# Patient Record
Sex: Male | Born: 1950 | Race: White | Hispanic: No | Marital: Married | Smoking: Former smoker
Health system: Southern US, Community
[De-identification: ages and names within clinical notes are randomized; demographics above are authoritative.]

## PROBLEM LIST (undated history)

## (undated) DIAGNOSIS — C44321 Squamous cell carcinoma of skin of nose: Principal | ICD-10-CM

## (undated) DIAGNOSIS — H547 Unspecified visual loss: Secondary | ICD-10-CM

## (undated) DIAGNOSIS — G4733 Obstructive sleep apnea (adult) (pediatric): Secondary | ICD-10-CM

## (undated) DIAGNOSIS — I10 Essential (primary) hypertension: Secondary | ICD-10-CM

## (undated) DIAGNOSIS — J349 Unspecified disorder of nose and nasal sinuses: Secondary | ICD-10-CM

## (undated) DIAGNOSIS — C801 Malignant (primary) neoplasm, unspecified: Secondary | ICD-10-CM

## (undated) DIAGNOSIS — K219 Gastro-esophageal reflux disease without esophagitis: Secondary | ICD-10-CM

## (undated) DIAGNOSIS — D630 Anemia in neoplastic disease: Principal | ICD-10-CM

## (undated) HISTORY — PX: CATARACT EXTRACTION: SUR2

## (undated) HISTORY — PX: PARTIAL HIP ARTHROPLASTY: SHX733

## (undated) HISTORY — PX: ENDARTERECTOMY: SHX5162

## (undated) HISTORY — DX: Obstructive sleep apnea (adult) (pediatric): G47.33

## (undated) HISTORY — PX: ESOPHAGEAL DILATION: SHX303

## (undated) HISTORY — DX: Squamous cell carcinoma of skin of nose: C44.321

## (undated) HISTORY — DX: Anemia in neoplastic disease: D63.0

## (undated) HISTORY — PX: FACIAL RHYTIDECTOMY: SHX1573

---

## 2014-10-28 ENCOUNTER — Encounter (HOSPITAL_COMMUNITY): Payer: Self-pay | Admitting: Emergency Medicine

## 2014-10-28 DIAGNOSIS — R5383 Other fatigue: Secondary | ICD-10-CM | POA: Diagnosis not present

## 2014-10-28 DIAGNOSIS — Z91041 Radiographic dye allergy status: Secondary | ICD-10-CM | POA: Insufficient documentation

## 2014-10-28 DIAGNOSIS — C7989 Secondary malignant neoplasm of other specified sites: Secondary | ICD-10-CM | POA: Diagnosis not present

## 2014-10-28 DIAGNOSIS — C7839 Secondary malignant neoplasm of other respiratory organs: Secondary | ICD-10-CM | POA: Diagnosis not present

## 2014-10-28 DIAGNOSIS — I10 Essential (primary) hypertension: Secondary | ICD-10-CM | POA: Diagnosis not present

## 2014-10-28 DIAGNOSIS — K219 Gastro-esophageal reflux disease without esophagitis: Secondary | ICD-10-CM | POA: Diagnosis not present

## 2014-10-28 DIAGNOSIS — Z9689 Presence of other specified functional implants: Secondary | ICD-10-CM | POA: Insufficient documentation

## 2014-10-28 DIAGNOSIS — E878 Other disorders of electrolyte and fluid balance, not elsewhere classified: Secondary | ICD-10-CM | POA: Diagnosis not present

## 2014-10-28 DIAGNOSIS — E860 Dehydration: Secondary | ICD-10-CM | POA: Diagnosis present

## 2014-10-28 LAB — CBC WITH DIFFERENTIAL/PLATELET
BASOS ABS: 0 10*3/uL (ref 0.0–0.1)
BASOS PCT: 0 % (ref 0–1)
EOS ABS: 0.2 10*3/uL (ref 0.0–0.7)
EOS PCT: 1 % (ref 0–5)
HEMATOCRIT: 34.8 % — AB (ref 39.0–52.0)
Hemoglobin: 11.2 g/dL — ABNORMAL LOW (ref 13.0–17.0)
LYMPHS PCT: 10 % — AB (ref 12–46)
Lymphs Abs: 1.6 10*3/uL (ref 0.7–4.0)
MCH: 28.4 pg (ref 26.0–34.0)
MCHC: 32.2 g/dL (ref 30.0–36.0)
MCV: 88.3 fL (ref 78.0–100.0)
MONO ABS: 1.1 10*3/uL — AB (ref 0.1–1.0)
Monocytes Relative: 7 % (ref 3–12)
Neutro Abs: 12.5 10*3/uL — ABNORMAL HIGH (ref 1.7–7.7)
Neutrophils Relative %: 82 % — ABNORMAL HIGH (ref 43–77)
Platelets: 412 10*3/uL — ABNORMAL HIGH (ref 150–400)
RBC: 3.94 MIL/uL — ABNORMAL LOW (ref 4.22–5.81)
RDW: 15.2 % (ref 11.5–15.5)
WBC: 15.4 10*3/uL — ABNORMAL HIGH (ref 4.0–10.5)

## 2014-10-28 LAB — COMPREHENSIVE METABOLIC PANEL
ALT: 10 U/L (ref 0–53)
AST: 12 U/L (ref 0–37)
Albumin: 3.5 g/dL (ref 3.5–5.2)
Alkaline Phosphatase: 124 U/L — ABNORMAL HIGH (ref 39–117)
Anion gap: 19 — ABNORMAL HIGH (ref 5–15)
BUN: 13 mg/dL (ref 6–23)
CALCIUM: 9.8 mg/dL (ref 8.4–10.5)
CO2: 23 mEq/L (ref 19–32)
CREATININE: 0.98 mg/dL (ref 0.50–1.35)
Chloride: 88 mEq/L — ABNORMAL LOW (ref 96–112)
GFR calc Af Amer: >90 mL/min (ref >90)
GFR, EST NON AFRICAN AMERICAN: 85 mL/min — AB (ref >90)
Glucose, Bld: 98 mg/dL (ref 70–99)
Potassium: 5.5 mEq/L — ABNORMAL HIGH (ref 3.7–5.3)
Sodium: 130 mEq/L — ABNORMAL LOW (ref 137–147)
TOTAL PROTEIN: 8.2 g/dL (ref 6.0–8.3)
Total Bilirubin: 0.4 mg/dL (ref 0.3–1.2)

## 2014-10-28 NOTE — ED Notes (Signed)
Pt. requesting blood tests for electrolytes , pt. suspects dehydration due to poor appetite , low fluid intake , insomnia and headache.

## 2014-10-29 ENCOUNTER — Emergency Department (HOSPITAL_COMMUNITY)
Admission: EM | Admit: 2014-10-29 | Discharge: 2014-10-29 | Disposition: A | Discharge status: Home / Self Care | Payer: BC Managed Care – PPO | Attending: Emergency Medicine | Admitting: Emergency Medicine

## 2014-10-29 ENCOUNTER — Emergency Department (HOSPITAL_COMMUNITY): Payer: BC Managed Care – PPO

## 2014-10-29 DIAGNOSIS — E860 Dehydration: Secondary | ICD-10-CM

## 2014-10-29 DIAGNOSIS — R5383 Other fatigue: Secondary | ICD-10-CM

## 2014-10-29 HISTORY — DX: Gastro-esophageal reflux disease without esophagitis: K21.9

## 2014-10-29 HISTORY — DX: Malignant (primary) neoplasm, unspecified: C80.1

## 2014-10-29 HISTORY — DX: Essential (primary) hypertension: I10

## 2014-10-29 LAB — I-STAT CHEM 8, ED
BUN: 9 mg/dL (ref 6–23)
Calcium, Ion: 1.14 mmol/L (ref 1.13–1.30)
Chloride: 95 mEq/L — ABNORMAL LOW (ref 96–112)
Creatinine, Ser: 0.9 mg/dL (ref 0.50–1.35)
Glucose, Bld: 127 mg/dL — ABNORMAL HIGH (ref 70–99)
HCT: 34 % — ABNORMAL LOW (ref 39.0–52.0)
Hemoglobin: 11.6 g/dL — ABNORMAL LOW (ref 13.0–17.0)
Potassium: 4 mEq/L (ref 3.7–5.3)
Sodium: 132 mEq/L — ABNORMAL LOW (ref 137–147)
TCO2: 25 mmol/L (ref 0–100)

## 2014-10-29 LAB — URINALYSIS, ROUTINE W REFLEX MICROSCOPIC
Bilirubin Urine: NEGATIVE
Glucose, UA: NEGATIVE mg/dL
Ketones, ur: 15 mg/dL — AB
LEUKOCYTES UA: NEGATIVE
Nitrite: NEGATIVE
PROTEIN: NEGATIVE mg/dL
Specific Gravity, Urine: 1.005 (ref 1.005–1.030)
Urobilinogen, UA: 0.2 mg/dL (ref 0.0–1.0)
pH: 7.5 (ref 5.0–8.0)

## 2014-10-29 LAB — MAGNESIUM: Magnesium: 2.5 mg/dL (ref 1.5–2.5)

## 2014-10-29 LAB — URINE MICROSCOPIC-ADD ON

## 2014-10-29 MED ORDER — ONDANSETRON HCL 4 MG/2ML IJ SOLN
4.0000 mg | Freq: Once | INTRAMUSCULAR | Status: AC
Start: 2014-10-29 — End: 2014-10-29
  Administered 2014-10-29: 4 mg via INTRAVENOUS

## 2014-10-29 MED ORDER — MORPHINE SULFATE 4 MG/ML IJ SOLN
6.0000 mg | Freq: Once | INTRAMUSCULAR | Status: AC
  Administered 2014-10-29: 6 mg via INTRAVENOUS
  Filled 2014-10-29: qty 2

## 2014-10-29 MED ORDER — DEXTROSE 5 % IV BOLUS
1000.0000 mL | Freq: Once | INTRAVENOUS | Status: AC
  Administered 2014-10-29: 1000 mL via INTRAVENOUS

## 2014-10-29 MED ORDER — SODIUM CHLORIDE 0.9 % IV BOLUS (SEPSIS)
1000.0000 mL | Freq: Once | INTRAVENOUS | Status: AC
  Administered 2014-10-29: 1000 mL via INTRAVENOUS

## 2014-10-29 MED ORDER — SODIUM CHLORIDE 0.9 % IV BOLUS (SEPSIS): 2000.0000 mL | Freq: Once | INTRAVENOUS | Status: DC

## 2014-10-29 NOTE — ED Notes (Signed)
Pt is aware that urine is needed for testing, urinal at bedside. 

## 2014-10-29 NOTE — ED Notes (Signed)
Patient to xray.

## 2014-10-29 NOTE — ED Notes (Signed)
Patient returned from xray.

## 2014-10-29 NOTE — ED Provider Notes (Signed)
CSN: [id number]     Arrival date & time 10/28/14  2243 History   First MD Initiated Contact with Patient 10/29/14 0111     Chief Complaint  Patient presents with  . Dehydration     (Consider location/radiation/quality/duration/timing/severity/associated sxs/prior Treatment) HPI  Timothy Stevenson is a 63 y.o. 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 Patient states his cancer is terminal and he has been going through experimental treatments. He has an appointment on November 16 in [place] for more trials. He has not been taking any chemotherapy or radiation for the past month. Over the last 3 days he's noted decreased appetite and weakness. He has had this happen before he requires IV fluids. This occurred 4 months ago at [facility] where he was admitted for the same. He states he just has no appetite, he denies any abdominal pain nausea or vomiting. He subsequently has severe pain in his face as well. He takes oxycodone at home which is not treating it effectively. Because of all this patient had trouble sleeping as well. He's had no fevers or recent infections. He denies any changes in his urine. He has a history of constipation and is taking Maalox, his last bowel movement was today. Patient has no further complaints.  10 Systems reviewed and are negative for acute change except as noted in the HPI.     Past Medical History  Diagnosis Date  . Hypertension   . GERD (gastroesophageal reflux disease)   . Cancer     STAGE 4 SQUAMOUS CELL CARCINOMA " TERMINAL " METASTASIS TO SINUS./ FACE    Past Surgical History  Procedure Laterality Date  . Partial hip arthroplasty     No family history on file. History  Substance Use Topics  . Smoking status: Former Research scientist (life sciences)  . Smokeless tobacco: Not on file  . Alcohol Use: No    Review of Systems    Allergies  Ivp dye  Home Medications   Prior  to Admission medications   Medication Sig Start Date End Date Taking? Authorizing Provider  acetaminophen (TYLENOL) 500 MG tablet Take 1,000 mg by mouth every 6 (six) hours as needed for mild pain.   Yes Historical Provider, MD  amLODipine-valsartan (EXFORGE) 5-160 MG per tablet Take 1 tablet by mouth daily.   Yes Historical Provider, MD  ciprofloxacin (CILOXAN) 0.3 % ophthalmic solution Place 1 drop into the right eye 3 (three) times daily. Administer 1 drop, every 2 hours, while awake, for 2 days. Then 1 drop, every 4 hours, while awake, for the next 5 days.   Yes Historical Provider, MD  dexamethasone (DECADRON) 0.1 % ophthalmic suspension Place 1 drop into the right eye 3 (three) times daily.   Yes Historical Provider, MD  dexamethasone (DECADRON) 4 MG tablet Take 4 mg by mouth 2 (two) times daily with a meal.   Yes Historical Provider, MD  diphenhydramine-acetaminophen (TYLENOL PM) 25-500 MG TABS Take 2 tablets by mouth at bedtime as needed (pain/sleep).   Yes Historical Provider, MD  Magnesium Hydroxide (MILK OF MAGNESIA PO) Take 1 Applicatorful by mouth daily as needed.   Yes Historical Provider, MD  omeprazole (PRILOSEC) 20 MG capsule Take 20 mg by mouth daily.   Yes Historical Provider, MD  oxyCODONE (ROXICODONE) 15 MG immediate release tablet Take 15 mg by mouth every morning.   Yes Historical Provider, MD  Oxycodone HCl 10 MG TABS Take 10 mg  by mouth every 4 (four) hours as needed (pain).   Yes Historical Provider, MD   BP 165/74 mmHg  Pulse 93  Temp(Src) 98.9 F (37.2 C) (Oral)  Resp 18  Ht 5\' 9"  (1.753 m)  Wt 139 lb (63.05 kg)  BMI 20.52 kg/m2  SpO2 99% Physical Exam  Constitutional: He is oriented to person, place, and time. Vital signs are normal. He appears well-developed and well-nourished.  Non-toxic appearance. He does not appear ill. No distress.  HENT:  Nose: Nose normal.  Mouth/Throat: Oropharynx is clear and moist. No oropharyngeal exudate.  Entire left side of face  is status post removal with synthetic material replacing it.  Eyes: Conjunctivae and EOM are normal. Pupils are equal, round, and reactive to light. No scleral icterus.  Neck: Normal range of motion. Neck supple. No tracheal deviation, no edema, no erythema and normal range of motion present. No thyroid mass and no thyromegaly present.  Cardiovascular: Normal rate, regular rhythm, S1 normal, S2 normal, normal heart sounds, intact distal pulses and normal pulses.  Exam reveals no gallop and no friction rub.   No murmur heard. Pulses:      Radial pulses are 2+ on the right side, and 2+ on the left side.       Dorsalis pedis pulses are 2+ on the right side, and 2+ on the left side.  Pulmonary/Chest: Effort normal and breath sounds normal. No respiratory distress. He has no wheezes. He has no rhonchi. He has no rales.  Abdominal: Soft. Normal appearance and bowel sounds are normal. He exhibits no distension, no ascites and no mass. There is no hepatosplenomegaly. There is no tenderness. There is no rebound, no guarding and no CVA tenderness.  Musculoskeletal: Normal range of motion. He exhibits no edema or tenderness.  Lymphadenopathy:    He has no cervical adenopathy.  Neurological: He is alert and oriented to person, place, and time. He has normal strength. No sensory deficit. He exhibits normal muscle tone. GCS eye subscore is 4. GCS verbal subscore is 5. GCS motor subscore is 6.  Skin: Skin is warm, dry and intact. No petechiae and no rash noted. He is not diaphoretic. No erythema. No pallor.  Psychiatric: He has a normal mood and affect. His behavior is normal. Judgment normal.  Nursing note and vitals reviewed.   ED Course  Procedures (including critical care time) Labs Review Labs Reviewed  CBC WITH DIFFERENTIAL - Abnormal; Notable for the following:    WBC 15.4 (*)    RBC 3.94 (*)    Hemoglobin 11.2 (*)    HCT 34.8 (*)    Platelets 412 (*)    Neutrophils Relative % 82 (*)    Neutro  Abs 12.5 (*)    Lymphocytes Relative 10 (*)    Monocytes Absolute 1.1 (*)    All other components within normal limits  COMPREHENSIVE METABOLIC PANEL - Abnormal; Notable for the following:    Sodium 130 (*)    Potassium 5.5 (*)    Chloride 88 (*)    Alkaline Phosphatase 124 (*)    GFR calc non Af Amer 85 (*)    Anion gap 19 (*)    All other components within normal limits  URINE CULTURE  URINALYSIS, ROUTINE W REFLEX MICROSCOPIC  MAGNESIUM    Imaging Review No results found.   EKG Interpretation None      MDM   Final diagnoses:  Fatigue    Patient presents emergency department for dehydration and electrolyte  abnormalities. He was given a liter normal saline and D5 normal saline for treatment. Patient has a small anion gap. Also given morphine for pain. He was also given food to eat. He was able to tolerate this and states he feels better. Patient has pain medication at home and also follow-up in [facility] Monday. Repeat labs revealed anion gap is closed at 12. His vital signs remain within his normal limits and is safe for discharge.  Everlene Balls, MD 10/29/14 0630

## 2014-10-29 NOTE — Discharge Instructions (Signed)
Dehydration, Adult Timothy Stevenson, you were seen today for dehydration. You were given fluids and food. Follow-up with your medical appointment in [place] this coming Monday. If any symptoms worsen come back to emergency department a medially for repeat evaluation. Thank you. Dehydration means your body does not have as much fluid as it needs. Your kidneys, brain, and heart will not work properly without the right amount of fluids and salt.  HOME CARE  Ask your doctor how to replace body fluid losses (rehydrate).  Drink enough fluids to keep your pee (urine) clear or pale yellow.  Drink small amounts of fluids often if you feel sick to your stomach (nauseous) or throw up (vomit).  Eat like you normally do.  Avoid:  Foods or drinks high in sugar.  Bubbly (carbonated) drinks.  Juice.  Very hot or cold fluids.  Drinks with caffeine.  Fatty, greasy foods.  Alcohol.  Tobacco.  Eating too much.  Gelatin desserts.  Wash your hands to avoid spreading germs (bacteria, viruses).  Only take medicine as told by your doctor.  Keep all doctor visits as told. GET HELP RIGHT AWAY IF:   You cannot drink something without throwing up.  You get worse even with treatment.  Your vomit has blood in it or looks greenish.  Your poop (stool) has blood in it or looks black and tarry.  You have not peed in 6 to 8 hours.  You pee a small amount of very dark pee.  You have a fever.  You pass out (faint).  You have belly (abdominal) pain that gets worse or stays in one spot (localizes).  You have a rash, stiff neck, or bad headache.  You get easily annoyed, sleepy, or are hard to wake up.  You feel weak, dizzy, or very thirsty. MAKE SURE YOU:   Understand these instructions.  Will watch your condition.  Will get help right away if you are not doing well or get worse. Document Released: 09/28/2009 Document Revised: 02/24/2012 Document Reviewed: 07/22/2011 [facility]  Patient Information 2015 [place], [place]. This information is not intended to replace advice given to you by your health care provider. Make sure you discuss any questions you have with your health care provider.

## 2014-10-30 LAB — URINE CULTURE
Colony Count: NO GROWTH
Culture: NO GROWTH

## 2015-01-16 ENCOUNTER — Telehealth: Payer: Self-pay | Admitting: Hematology and Oncology

## 2015-01-16 NOTE — Telephone Encounter (Signed)
Per staff message from NG schedule pt for new pt appt 2/3 @ 2pm - 1:30pm for [facility]. left message for pt. will call again tomorrow. Also alerted Blima Singer in HIM.

## 2015-01-17 ENCOUNTER — Telehealth: Payer: Self-pay | Admitting: *Deleted

## 2015-01-17 NOTE — Telephone Encounter (Signed)
Placed introductory call to patient, briefly explained my role as the Head & Neck Oncology Navigator and that I would be joining him during his appt with Dr. Alvy Bimler.  I confirmed his understanding of his 1:30 FC appt and 2:00 appt with Dr. Alvy Bimler.  I encouraged him to bring documentation of health history including information re: medication he is considering for his cancer tmt, list of medications.  I confirmed his understanding of [place] location, explained availability of valet service, explained check-in procedure.    Gayleen Orem, RN, BSN, [place] at [place] [phone]

## 2015-01-18 ENCOUNTER — Ambulatory Visit (HOSPITAL_BASED_OUTPATIENT_CLINIC_OR_DEPARTMENT_OTHER): Payer: Medicare Other | Admitting: Hematology and Oncology

## 2015-01-18 ENCOUNTER — Encounter: Payer: Self-pay | Admitting: Hematology and Oncology

## 2015-01-18 ENCOUNTER — Encounter (INDEPENDENT_AMBULATORY_CARE_PROVIDER_SITE_OTHER): Payer: Self-pay

## 2015-01-18 ENCOUNTER — Ambulatory Visit (HOSPITAL_BASED_OUTPATIENT_CLINIC_OR_DEPARTMENT_OTHER): Payer: Medicare Other

## 2015-01-18 ENCOUNTER — Encounter: Payer: Self-pay | Admitting: *Deleted

## 2015-01-18 ENCOUNTER — Ambulatory Visit: Payer: Medicare Other

## 2015-01-18 ENCOUNTER — Telehealth: Payer: Self-pay | Admitting: Hematology and Oncology

## 2015-01-18 VITALS — BP 156/78 | HR 83 | Temp 97.8°F | Resp 18 | Ht 69.0 in | Wt 118.2 lb

## 2015-01-18 DIAGNOSIS — H5440 Blindness, one eye, unspecified eye: Secondary | ICD-10-CM

## 2015-01-18 DIAGNOSIS — D630 Anemia in neoplastic disease: Secondary | ICD-10-CM

## 2015-01-18 DIAGNOSIS — C44321 Squamous cell carcinoma of skin of nose: Secondary | ICD-10-CM

## 2015-01-18 DIAGNOSIS — H9193 Unspecified hearing loss, bilateral: Secondary | ICD-10-CM

## 2015-01-18 DIAGNOSIS — H5441 Blindness, right eye, normal vision left eye: Secondary | ICD-10-CM

## 2015-01-18 DIAGNOSIS — G893 Neoplasm related pain (acute) (chronic): Secondary | ICD-10-CM

## 2015-01-18 HISTORY — DX: Squamous cell carcinoma of skin of nose: C44.321

## 2015-01-18 LAB — COMPREHENSIVE METABOLIC PANEL (CC13)
ALT: 8 U/L (ref 0–55)
ANION GAP: 14 meq/L — AB (ref 3–11)
AST: 11 U/L (ref 5–34)
Albumin: 3 g/dL — ABNORMAL LOW (ref 3.5–5.0)
Alkaline Phosphatase: 111 U/L (ref 40–150)
BILIRUBIN TOTAL: 0.29 mg/dL (ref 0.20–1.20)
BUN: 19.7 mg/dL (ref 7.0–26.0)
CALCIUM: 9.1 mg/dL (ref 8.4–10.4)
CHLORIDE: 98 meq/L (ref 98–109)
CO2: 24 meq/L (ref 22–29)
Creatinine: 1.2 mg/dL (ref 0.7–1.3)
EGFR: 66 mL/min/{1.73_m2} — ABNORMAL LOW (ref >90)
GLUCOSE: 112 mg/dL (ref 70–140)
Potassium: 4 mEq/L (ref 3.5–5.1)
Sodium: 136 mEq/L (ref 136–145)
Total Protein: 7.7 g/dL (ref 6.4–8.3)

## 2015-01-18 LAB — CBC WITH DIFFERENTIAL/PLATELET
BASO%: 1.1 % (ref 0.0–2.0)
Basophils Absolute: 0.2 10*3/uL — ABNORMAL HIGH (ref 0.0–0.1)
EOS%: 1.6 % (ref 0.0–7.0)
Eosinophils Absolute: 0.3 10*3/uL (ref 0.0–0.5)
HEMATOCRIT: 27.8 % — AB (ref 38.4–49.9)
HEMOGLOBIN: 8.5 g/dL — AB (ref 13.0–17.1)
LYMPH#: 1.7 10*3/uL (ref 0.9–3.3)
LYMPH%: 8.4 % — AB (ref 14.0–49.0)
MCH: 25.8 pg — ABNORMAL LOW (ref 27.2–33.4)
MCHC: 30.4 g/dL — AB (ref 32.0–36.0)
MCV: 84.9 fL (ref 79.3–98.0)
MONO#: 0.8 10*3/uL (ref 0.1–0.9)
MONO%: 3.9 % (ref 0.0–14.0)
NEUT%: 85 % — ABNORMAL HIGH (ref 39.0–75.0)
NEUTROS ABS: 17.2 10*3/uL — AB (ref 1.5–6.5)
PLATELETS: 547 10*3/uL — AB (ref 140–400)
RBC: 3.27 10*6/uL — AB (ref 4.20–5.82)
RDW: 16.7 % — ABNORMAL HIGH (ref 11.0–14.6)
WBC: 20.3 10*3/uL — ABNORMAL HIGH (ref 4.0–10.3)

## 2015-01-18 LAB — LACTATE DEHYDROGENASE (CC13): LDH: 117 U/L — ABNORMAL LOW (ref 125–245)

## 2015-01-18 MED ORDER — OXYCONTIN 10 MG PO T12A: 10.0000 mg | EXTENDED_RELEASE_TABLET | Freq: Two times a day (BID) | ORAL | Status: DC

## 2015-01-18 MED ORDER — OXYCODONE HCL 15 MG PO TABS
15.0000 mg | ORAL_TABLET | ORAL | Status: DC
Start: 2015-01-18 — End: 2015-01-18

## 2015-01-18 MED ORDER — OXYCODONE HCL 15 MG PO TABS: 15.0000 mg | ORAL_TABLET | Freq: Four times a day (QID) | ORAL | Status: DC | PRN

## 2015-01-18 NOTE — Progress Notes (Signed)
Checked in new pt with no financial concerns prior to seeing the dr.  Pt has 2 insurances so financial assistance may not be needed but he has Raquel's card for any billing questions or concerns. °

## 2015-01-18 NOTE — Telephone Encounter (Signed)
gv and printed appt sched and avs for pt for Feb...sed added tx...sent to lab

## 2015-01-19 ENCOUNTER — Other Ambulatory Visit: Payer: Self-pay | Admitting: Hematology and Oncology

## 2015-01-19 ENCOUNTER — Encounter: Payer: Self-pay | Admitting: Hematology and Oncology

## 2015-01-19 ENCOUNTER — Telehealth: Payer: Self-pay | Admitting: *Deleted

## 2015-01-19 ENCOUNTER — Encounter: Payer: Self-pay | Admitting: *Deleted

## 2015-01-19 DIAGNOSIS — H543 Unqualified visual loss, both eyes: Secondary | ICD-10-CM | POA: Insufficient documentation

## 2015-01-19 DIAGNOSIS — D630 Anemia in neoplastic disease: Secondary | ICD-10-CM | POA: Insufficient documentation

## 2015-01-19 DIAGNOSIS — C44321 Squamous cell carcinoma of skin of nose: Secondary | ICD-10-CM

## 2015-01-19 DIAGNOSIS — H9193 Unspecified hearing loss, bilateral: Secondary | ICD-10-CM | POA: Insufficient documentation

## 2015-01-19 DIAGNOSIS — G893 Neoplasm related pain (acute) (chronic): Secondary | ICD-10-CM | POA: Insufficient documentation

## 2015-01-19 HISTORY — DX: Anemia in neoplastic disease: D63.0

## 2015-01-19 NOTE — Assessment & Plan Note (Signed)
This is an unfortunate patient with significant reconstruction surgery, chemotherapy and radiation treatment with recurrence of disease. He had obtained multiple second opinions. The patient is aware that any form of treatment would be strictly palliative. He had obtained compassionate care use of Pembrolizumab from the drug company and is interested to try this. I will proceed to order baseline PET CT scan and MRI before we proceed with treatment. The patient will need chemotherapy education class and port placement along with blood work. I plan to see him back in 2 weeks to review all test results.

## 2015-01-19 NOTE — Progress Notes (Unsigned)
Delivered copies of patient's DNR, Living Will, and HCPOA to [facility] for scanning.  Gayleen Orem, RN, BSN, [place] at [place] [phone]

## 2015-01-19 NOTE — Telephone Encounter (Signed)
S/w pt's wife and informed her of Dr. Calton Dach message below.  She verbalized understanding.

## 2015-01-19 NOTE — Assessment & Plan Note (Signed)
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 I recommend he wear an eye patch at nighttime to protect his eye from further damage from dry eyes.

## 2015-01-19 NOTE — Assessment & Plan Note (Signed)
I refill his pressure and pain medicine. We discussed about narcotic refill policy.

## 2015-01-19 NOTE — Telephone Encounter (Signed)
-----   Message from Heath Lark, MD sent at 01/19/2015  2:14 PM EST ----- Regarding: lab result Pls let the patient/care giver know that he is profoundly anemic yesterday presumably from bleeding. I recommend he takes an iron supplement OTC (cheaper the better) at night time before going to bed. I plan to recheck it again when he returns in 2 weeks.

## 2015-01-19 NOTE — Assessment & Plan Note (Signed)
He has significant bilateral hearing loss which I think could be disease-related and related to prior treatment with cisplatin. For this reason, I would not give him any more chemotherapy with cisplatin.

## 2015-01-19 NOTE — Progress Notes (Signed)
Met with patient during initial consult with Dr. Alvy Bimler.  His wife and step-dtr accompanied him.   1. Further explained my role as his navigator and as a member of the Care Team, provided contact information, encouraged them to contact me with questions/concerns as treatments/procedures begin. 2. Provided New Patient Information packet:  Contact information for physician and navigator  Fall Prevention Patient Safety Plan  Appointment Guideline  [facility] campus map with highlight of [place] 3. Wife brought DNR, Living Will, HCPOA documentation.  I made copies for delivery to [facility] for scanning. 4. Assisted with post-consult appt scheduling.  They understand they will get additional phone calls for scheduling of PET, MRI, PAC. 5. They understand they can contact me with questions/concerns.  Gayleen Orem, RN, BSN, [place] at [place] [phone]

## 2015-01-19 NOTE — Assessment & Plan Note (Signed)
This is likely anemia of chronic disease along with intermittent bleeding from his cancer. The patient denies recent history of bleeding such as epistaxis, hematuria or hematochezia. He is asymptomatic from the anemia. We will observe for now.  He does not require transfusion now.

## 2015-01-19 NOTE — Progress Notes (Signed)
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 male is here because of recurrent invasive squamous cell carcinoma. He had significant history treated elsewhere. Summary of oncologic history is as follows:   Squamous cell cancer of skin of ala nasi   12/20/2007 Surgery He had Mohs surgery to his face. Pathology showed squamous cell carcinoma   01/19/2009 Surgery He had repeat Mohs surgery to the face   05/19/2012 Surgery Left Caldwell-luc approach left infraorbital nerve with selective left neck LN dissection   05/06/2013 Surgery he has repeat nasal endoscopy with extensive sinus surgery, nose removal and reconstruction surgery   05/14/2013 Surgery he had exenteration of left orbital content, parotid dissection, facial nerve removal, radical neck dissection and free flap reconstruction   06/01/2013 Surgery he had reconstruction surgery and repair of CSF leak   07/06/2013 Surgery He had cranial procedure to repeat CSF leak   07/19/2013 - 08/30/2013 Radiation Therapy he had concurrent chemo/RT   07/19/2013 - 08/30/2013 Chemotherapy he had concurrent chemo/RT with cisplatin   09/29/2013 Imaging PET/CT scan showed residual disease in sunus bone and hypermetabolic LN in the right neck   12/08/2013 Surgery He had repeat nasal endoscopy and debridement   12/14/2013 Pathology Results Repeat sinus biopsy at [facility] confirmed recurrence of squamous cell carcinoma   03/21/2014 - 09/19/2014 Chemotherapy He received erlotinib at 75 mg/m2   05/03/2014 Imaging Ct sinus showed progression of disease   09/20/2014 Imaging CT sinus & MRI brain showed soft tissue thickening in sinuses worrisome of  disease recurrence   11/14/2014 - 11/14/2014 Chemotherapy He received only 1 dose of docetaxel    According to the patient, the first initial presentation was due to presence of a small pinpoint skin lesion in 2009. He had multiple surgery, chemotherapy and radiation therapy. He has exhausted almost all treatment options and recently has received insurance approval for compassionate care at use of Pembrolizumab. However, he was not able to find a cancer Center who is willing to give the treatment. He has family members here in [place] and is interested to transfer his care here. he has significant bilateral hearing deficit, worse in the left compared to the right. He denies difficulties with chewing food, swallowing difficulties, painful swallowing, changes in the quality of his voice or abnormal weight loss. He has intermittent bleeding from the right for head and have significant blurriness of vision from the right eye. He has altered taste sensation due to loss of smell perception. MEDICAL HISTORY:  Past Medical History  Diagnosis Date  . Hypertension   . GERD (gastroesophageal reflux disease)   . 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 Squamous cell cancer of skin of ala nasi 01/18/2015  . 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 OSA (obstructive sleep apnea)     SURGICAL HISTORY: Past Surgical History  Procedure Laterality Date  . Partial hip arthroplasty    . Endarterectomy    . Esophageal dilation    . Cataract extraction      SOCIAL HISTORY: History   Social History  . Marital Status: Married    Spouse Name: N/A    Number  of Children: N/A  . Years of Education: N/A   Occupational History  . Not on file.   Social History Main Topics  . Smoking status: Former Smoker -- 1.50 packs/day for 20 years    Quit date: 12/16/2004  . Smokeless tobacco: Never Used  . Alcohol Use: No  . Drug Use: No  . Sexual Activity: Not on  file   Other Topics Concern  . Not on file   Social History Narrative    FAMILY HISTORY: Family History  Problem Relation Age of Onset  . Cancer Mother     lymphoma  . Cancer Father     stomach ca  . Cancer Maternal Aunt     ca  . Cancer Maternal Uncle     ca  . Cancer Paternal Aunt     ca  . Cancer Paternal Uncle     ca    ALLERGIES:  is allergic to ivp dye.  MEDICATIONS:  Current Outpatient Prescriptions  Medication Sig Dispense Refill  . acetaminophen (TYLENOL) 500 MG tablet Take 1,000 mg by mouth every 6 (six) hours as needed for mild pain.    . diphenhydramine-acetaminophen (TYLENOL PM) 25-500 MG TABS Take 2 tablets by mouth at bedtime as needed (pain/sleep).    . mirtazapine (REMERON) 7.5 MG tablet Take by mouth.    . OLANZapine (ZYPREXA) 5 MG tablet Take by mouth.    Marland Kitchen omeprazole (PRILOSEC) 20 MG capsule Take 20 mg by mouth daily.    Marland Kitchen oxyCODONE (ROXICODONE) 15 MG immediate release tablet Take 1 tablet (15 mg total) by mouth every 6 (six) hours as needed for pain. 60 tablet 0  . OXYCONTIN 10 MG T12A 12 hr tablet Take 1 tablet (10 mg total) by mouth every 12 (twelve) hours. 30 tablet 0  . Magnesium Hydroxide (MILK OF MAGNESIA PO) Take 1 Applicatorful by mouth daily as needed.    . Polyethylene Glycol 3350 (MIRALAX PO) Take by mouth.     No current facility-administered medications for this visit.    REVIEW OF SYSTEMS:   Constitutional: Denies fevers, chills or abnormal night sweats Ears, nose, mouth, throat, and face: Denies mucositis or sore throat Respiratory: Denies cough, dyspnea or wheezes Cardiovascular: Denies palpitation, chest discomfort or lower extremity swelling Gastrointestinal:  Denies nausea, heartburn or change in bowel habits Skin: Denies abnormal skin rashes Lymphatics: Denies new lymphadenopathy or easy bruising Neurological:Denies numbness, tingling or new weaknesses Behavioral/Psych: Mood is stable, no new changes  All other systems were  reviewed with the patient and are negative.  PHYSICAL EXAMINATION: ECOG PERFORMANCE STATUS: 1 - Symptomatic but completely ambulatory  Filed Vitals:   01/18/15 1335  BP: 156/78  Pulse: 83  Temp: 97.8 F (36.6 C)  Resp: 18   Filed Weights   01/18/15 1335  Weight: 118 lb 3.2 oz (53.615 kg)    GENERAL:alert, no distress and comfortable. He has a prosthesis on his face with his significant reconstruction surgery. 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 On the right eye, there is significant cornea ulceration.  OROPHARYNX: He has a hole on his face at the site of his nose. There is active bleeding coming from the temporal bone area close to the right eye.  NECK: Significant surgery to the neck with thickness from prior radiation and surgical scars. LYMPH:  no palpable lymphadenopathy in the cervical, axillary or inguinal LUNGS: clear to auscultation and percussion with normal breathing  effort HEART: regular rate & rhythm and no murmurs and no lower extremity edema ABDOMEN:abdomen soft, non-tender and normal bowel sounds Musculoskeletal:no cyanosis of digits and no clubbing  PSYCH: alert & oriented x 3 with fluent speech NEURO: no focal motor/sensory deficits  LABORATORY DATA:  I have reviewed the data as listed Lab Results  Component Value Date   WBC 20.3* 01/18/2015   HGB 8.5* 01/18/2015   HCT 27.8* 01/18/2015   MCV 84.9 01/18/2015   PLT 547* 01/18/2015   Lab Results  Component Value Date   NA 136 01/18/2015   K 4.0 01/18/2015   CL 95* 10/29/2014   CO2 24 01/18/2015   ASSESSMENT:  Recurrent squamous cell carcinoma of the Head & Neck  PLAN:  Squamous cell cancer of skin of ala nasi This is an unfortunate patient with significant reconstruction surgery, chemotherapy and radiation treatment with recurrence of disease. He had obtained multiple second opinions. The patient is aware that any form of  treatment would be strictly palliative. He had obtained compassionate care use of Pembrolizumab from the drug company and is interested to try this. I will proceed to order baseline PET CT scan and MRI before we proceed with treatment. The patient will need chemotherapy education class and port placement along with blood work. I plan to see him back in 2 weeks to review all test results.   Cancer associated pain I refill his pressure and pain medicine. We discussed about narcotic refill policy.   Anemia in neoplastic disease This is likely anemia of chronic disease along with intermittent bleeding from his cancer. The patient denies recent history of bleeding such as epistaxis, hematuria or hematochezia. He is asymptomatic from the anemia. We will observe for now.  He does not require transfusion now.    Hearing loss of both ears He has significant bilateral hearing loss which I think could be disease-related and related to prior treatment with cisplatin. For this reason, I would not give him any more chemotherapy with cisplatin.   Blindness of right eye He has peripheral vision on the right eye which I suspect could be due to invasion of cancer to the orbit. I recommend he wear an eye patch at nighttime to protect his eye from further damage from dry eyes.      Orders Placed This Encounter  Procedures  . MR Brain W Wo Contrast    Standing Status: Future     Number of Occurrences:      Standing Expiration Date: 02/22/2016    Order Specific Question:  Reason for exam:    Answer:  invasive skin cancer to orbit s/p surgeries    Order Specific Question:  Preferred imaging location?    Answer:  [facility]    Order Specific Question:  Does the patient have a pacemaker or implanted devices?    Answer:  No  . NM PET Image Restag (PS) Skull Base To Thigh    Standing Status: Future     Number of Occurrences:      Standing Expiration Date: 03/19/2016    Order Specific Question:   Reason for Exam (SYMPTOM  OR DIAGNOSIS REQUIRED)    Answer:  invasive skin cancer to orbit s/p surgeries    Order Specific Question:  Preferred imaging location?    Answer:  [facility]  . CBC with Differential/Platelet    Standing Status: Future     Number of Occurrences: 1     Standing Expiration Date: 02/22/2016  .  Comprehensive metabolic panel    Standing Status: Future     Number of Occurrences: 1     Standing Expiration Date: 02/22/2016  . Lactate dehydrogenase    Standing Status: Future     Number of Occurrences: 1     Standing Expiration Date: 02/22/2016    All questions were answered. The patient knows to call the clinic with any problems, questions or concerns. I spent 60 minutes counseling the patient face to face. The total time spent in the appointment was 80 minutes and more than 50% was on counseling.     Uri Turnbough, MD 1:25 PM 01/19/2015

## 2015-01-23 ENCOUNTER — Other Ambulatory Visit: Payer: Medicare Other

## 2015-01-26 ENCOUNTER — Telehealth: Payer: Self-pay | Admitting: *Deleted

## 2015-01-26 NOTE — Telephone Encounter (Signed)
Returned patient's VM in which he inquired about status of Keytruda receipt for his tmt next week.  I reached out to Deedra Ehrich who indicated he is aware of confirmation letter from [employer] that they will provide medication.  He is meeting with [employer] and will provide update after his meeting.  I returned patient's call, provided this information to his wife, indicated I would call them with an update upon my receipt.  Gayleen Orem, RN, BSN, [place] at [place] [phone]

## 2015-01-27 ENCOUNTER — Telehealth: Payer: Self-pay | Admitting: *Deleted

## 2015-01-27 ENCOUNTER — Other Ambulatory Visit: Payer: Self-pay | Admitting: Radiology

## 2015-01-27 NOTE — Telephone Encounter (Signed)
Returned patient's VM, spoke with wife, confirmed 7:30 arrival next Tuesday at [facility] for [facility] placement.  He had called because appt was not visible on MyChart.  Gayleen Orem, RN, BSN, [place] at [place] [phone]

## 2015-01-27 NOTE — Telephone Encounter (Signed)
Spoke with patient: 1. Informed him that our chief pharmacist met with [employer] rep today and has received faxed form to update provider information.  I noted I placed form on Dr. Calton Dach desk for her attention on Monday.   2. I answered his questions regarding next week's appts. He expressed appreciation for update.  Gayleen Orem, RN, BSN, [place] at [place] [phone]

## 2015-01-30 ENCOUNTER — Encounter: Payer: Self-pay | Admitting: *Deleted

## 2015-01-30 ENCOUNTER — Other Ambulatory Visit: Payer: Self-pay | Admitting: Radiology

## 2015-01-30 ENCOUNTER — Other Ambulatory Visit: Payer: Self-pay | Admitting: Hematology and Oncology

## 2015-01-30 ENCOUNTER — Ambulatory Visit: Payer: Medicare Other

## 2015-01-30 MED ORDER — LIDOCAINE-PRILOCAINE 2.5-2.5 % EX CREA
1.0000 | TOPICAL_CREAM | CUTANEOUS | Status: AC | PRN
Start: 2015-01-30 — End: ?

## 2015-01-30 NOTE — Progress Notes (Signed)
Per Deedra Ehrich, I faxed updated provider information to [employer].  Placed form and fax confirmation on his desk for follow-up tomorrow.  Gayleen Orem, RN, BSN, [place] at [place] [phone]

## 2015-01-31 ENCOUNTER — Other Ambulatory Visit: Payer: Self-pay | Admitting: *Deleted

## 2015-01-31 ENCOUNTER — Ambulatory Visit (HOSPITAL_COMMUNITY)
Admission: RE | Admit: 2015-01-31 | Discharge: 2015-01-31 | Disposition: A | Discharge status: Home / Self Care | Payer: Medicare Other | Source: Ambulatory Visit | Attending: Hematology and Oncology | Admitting: Hematology and Oncology

## 2015-01-31 ENCOUNTER — Telehealth: Payer: Self-pay | Admitting: *Deleted

## 2015-01-31 ENCOUNTER — Other Ambulatory Visit: Payer: Self-pay | Admitting: Hematology and Oncology

## 2015-01-31 ENCOUNTER — Encounter (HOSPITAL_COMMUNITY): Payer: Self-pay

## 2015-01-31 ENCOUNTER — Encounter: Payer: Self-pay | Admitting: *Deleted

## 2015-01-31 DIAGNOSIS — R918 Other nonspecific abnormal finding of lung field: Secondary | ICD-10-CM | POA: Insufficient documentation

## 2015-01-31 DIAGNOSIS — C44321 Squamous cell carcinoma of skin of nose: Secondary | ICD-10-CM

## 2015-01-31 DIAGNOSIS — H5441 Blindness, right eye, normal vision left eye: Secondary | ICD-10-CM | POA: Insufficient documentation

## 2015-01-31 DIAGNOSIS — Z87891 Personal history of nicotine dependence: Secondary | ICD-10-CM | POA: Insufficient documentation

## 2015-01-31 DIAGNOSIS — I10 Essential (primary) hypertension: Secondary | ICD-10-CM | POA: Diagnosis not present

## 2015-01-31 DIAGNOSIS — D630 Anemia in neoplastic disease: Secondary | ICD-10-CM | POA: Insufficient documentation

## 2015-01-31 DIAGNOSIS — Z807 Family history of other malignant neoplasms of lymphoid, hematopoietic and related tissues: Secondary | ICD-10-CM | POA: Insufficient documentation

## 2015-01-31 DIAGNOSIS — Z91041 Radiographic dye allergy status: Secondary | ICD-10-CM | POA: Insufficient documentation

## 2015-01-31 DIAGNOSIS — Z9221 Personal history of antineoplastic chemotherapy: Secondary | ICD-10-CM | POA: Insufficient documentation

## 2015-01-31 DIAGNOSIS — Z923 Personal history of irradiation: Secondary | ICD-10-CM | POA: Diagnosis not present

## 2015-01-31 DIAGNOSIS — D72829 Elevated white blood cell count, unspecified: Secondary | ICD-10-CM | POA: Insufficient documentation

## 2015-01-31 DIAGNOSIS — H9190 Unspecified hearing loss, unspecified ear: Secondary | ICD-10-CM | POA: Insufficient documentation

## 2015-01-31 DIAGNOSIS — Z79899 Other long term (current) drug therapy: Secondary | ICD-10-CM | POA: Diagnosis not present

## 2015-01-31 DIAGNOSIS — G4733 Obstructive sleep apnea (adult) (pediatric): Secondary | ICD-10-CM | POA: Insufficient documentation

## 2015-01-31 DIAGNOSIS — K219 Gastro-esophageal reflux disease without esophagitis: Secondary | ICD-10-CM | POA: Insufficient documentation

## 2015-01-31 DIAGNOSIS — Z800 Family history of malignant neoplasm of digestive organs: Secondary | ICD-10-CM | POA: Diagnosis not present

## 2015-01-31 LAB — APTT: aPTT: 34 seconds (ref 24–37)

## 2015-01-31 LAB — CBC WITH DIFFERENTIAL/PLATELET
Basophils Absolute: 0.1 10*3/uL (ref 0.0–0.1)
Basophils Relative: 0 % (ref 0–1)
EOS ABS: 0.9 10*3/uL — AB (ref 0.0–0.7)
EOS PCT: 4 % (ref 0–5)
HCT: 27.5 % — ABNORMAL LOW (ref 39.0–52.0)
Hemoglobin: 8.4 g/dL — ABNORMAL LOW (ref 13.0–17.0)
LYMPHS ABS: 1.6 10*3/uL (ref 0.7–4.0)
Lymphocytes Relative: 7 % — ABNORMAL LOW (ref 12–46)
MCH: 26.3 pg (ref 26.0–34.0)
MCHC: 30.5 g/dL (ref 30.0–36.0)
MCV: 86.2 fL (ref 78.0–100.0)
MONOS PCT: 6 % (ref 3–12)
Monocytes Absolute: 1.3 10*3/uL — ABNORMAL HIGH (ref 0.1–1.0)
NEUTROS PCT: 83 % — AB (ref 43–77)
Neutro Abs: 18.9 10*3/uL — ABNORMAL HIGH (ref 1.7–7.7)
Platelets: 618 10*3/uL — ABNORMAL HIGH (ref 150–400)
RBC: 3.19 MIL/uL — AB (ref 4.22–5.81)
RDW: 16.2 % — ABNORMAL HIGH (ref 11.5–15.5)
WBC: 22.7 10*3/uL — AB (ref 4.0–10.5)

## 2015-01-31 LAB — PROTIME-INR
INR: 1.09 (ref 0.00–1.49)
Prothrombin Time: 14.2 seconds (ref 11.6–15.2)

## 2015-01-31 LAB — GLUCOSE, CAPILLARY: Glucose-Capillary: 79 mg/dL (ref 70–99)

## 2015-01-31 MED ORDER — FLUDEOXYGLUCOSE F - 18 (FDG) INJECTION
5.9300 | Freq: Once | INTRAVENOUS | Status: AC | PRN
  Administered 2015-01-31: 5.93 via INTRAVENOUS

## 2015-01-31 MED ORDER — CEFAZOLIN SODIUM-DEXTROSE 2-3 GM-% IV SOLR: 2.0000 g | INTRAVENOUS | Status: DC

## 2015-01-31 MED ORDER — SODIUM CHLORIDE 0.9 % IV SOLN
INTRAVENOUS | Status: DC
Start: 2015-01-31 — End: 2015-02-01

## 2015-01-31 MED ORDER — OXYCODONE HCL ER 20 MG PO T12A: 20.0000 mg | EXTENDED_RELEASE_TABLET | Freq: Two times a day (BID) | ORAL | Status: AC

## 2015-01-31 NOTE — Telephone Encounter (Signed)
Faxed prescription information (second time) and provider update to Donell Beers, [employer] Patient Assistance Program.  I confirmed with her by phone that patient's Beryle Flock is being sent by [employer] for delivery tomorrow to [place].  I notified Adam Mitzi Davenport and Montel Clock.  Gayleen Orem, RN, BSN, [place] at [place] [phone]

## 2015-01-31 NOTE — H&P (Signed)
Chief Complaint: "I am here for a port."  Referring Physician(s): Gorsuch,Ni  History of Present Illness: Timothy Stevenson is a 64 y.o. male with recurrent invasive squamous cell carcinoma s/p surgical, radiation and chemotherapy. He has been seen BY Dr. Alvy Bimler and is scheduled today for image guided port a catheter placement. He denies any chest pain, shortness of breath or palpitations. He denies any active signs of bleeding or excessive bruising. He denies any recent fever or chills. He is on clindamycin x 5 days that was restarted by the patient with a refill on his previous prescription for sinus/prosthesis involved infection/odor. The patient denies any chronic oxygen use. He has previously tolerated sedation without complications.   Past Medical History  Diagnosis Date  . Hypertension   . GERD (gastroesophageal reflux disease)   . 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 Squamous cell cancer of skin of ala nasi 01/18/2015  . 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 OSA (obstructive sleep apnea)     Past Surgical History  Procedure Laterality Date  . Partial hip arthroplasty    . Endarterectomy    . Esophageal dilation    . Cataract extraction      Allergies: Aquaphor; Ivp dye; and Triple antibiotic  Medications: Prior to Admission medications   Medication Sig Start Date End Date Taking? Authorizing Provider  acetaminophen (TYLENOL) 500 MG tablet Take 1,000 mg by mouth every 6 (six) hours as needed for mild pain.   Yes Historical Provider, MD  diphenhydramine-acetaminophen (TYLENOL PM) 25-500 MG TABS Take 2 tablets by mouth at bedtime as needed (pain/sleep).   Yes Historical Provider, MD  ferrous fumarate (HEMOCYTE - 106 MG FE) 325 (106 FE) MG TABS tablet Take 1 tablet by mouth at bedtime.   Yes Historical Provider, MD  Magnesium Hydroxide (MILK OF MAGNESIA PO) Take 30 mLs by mouth daily as needed (constipation).    Yes  Historical Provider, MD  mirtazapine (REMERON) 15 MG tablet Take 7.5 mg by mouth at bedtime.   Yes Historical Provider, MD  OLANZapine (ZYPREXA) 5 MG tablet Take 5 mg by mouth at bedtime.    Yes Historical Provider, MD  omeprazole (PRILOSEC) 20 MG capsule Take 20 mg by mouth daily.   Yes Historical Provider, MD  ondansetron (ZOFRAN) 8 MG tablet Take 8 mg by mouth every 8 (eight) hours as needed for nausea or vomiting.   Yes Historical Provider, MD  OxyCODONE (OXYCONTIN) 20 mg T12A 12 hr tablet Take 20 mg by mouth every 12 (twelve) hours.   Yes Historical Provider, MD  oxycodone (ROXICODONE) 30 MG immediate release tablet Take 30 mg by mouth every 4 (four) hours as needed for pain.   Yes Historical Provider, MD  Polyethylene Glycol 3350 (MIRALAX PO) Take 1 packet by mouth daily as needed (constipation).    Yes Historical Provider, MD  lidocaine-prilocaine (EMLA) cream Apply 1 application topically as needed. 01/30/15   Heath Lark, MD  oxyCODONE (ROXICODONE) 15 MG immediate release tablet Take 1 tablet (15 mg total) by mouth every 6 (six) hours as needed for pain. Patient not taking: Reported on 01/27/2015 01/18/15   Heath Lark, MD  OXYCONTIN 10 MG T12A 12 hr tablet Take 1 tablet (10 mg total) by mouth every 12 (twelve) hours. Patient not taking: Reported on 01/27/2015 01/18/15   Heath Lark, MD     Family History  Problem Relation Age of Onset  .  Cancer Mother     lymphoma  . Cancer Father     stomach ca  . Cancer Maternal Aunt     ca  . Cancer Maternal Uncle     ca  . Cancer Paternal Aunt     ca  . Cancer Paternal Uncle     ca    History   Social History  . Marital Status: Married    Spouse Name: N/A  . Number of Children: N/A  . Years of Education: N/A   Social History Main Topics  . Smoking status: Former Smoker -- 1.50 packs/day for 20 years    Quit date: 12/16/2004  . Smokeless tobacco: Never Used  . Alcohol Use: No  . Drug Use: No  . Sexual Activity: Not on file   Other  Topics Concern  . None   Social History Narrative   Review of Systems: A 12 point ROS discussed and pertinent positives are indicated in the HPI above.  All other systems are negative.  Review of Systems  Vital Signs: BP 124/69 mmHg  Pulse 91  Temp(Src) 98.2 F (36.8 C) (Oral)  Resp 18  Ht 5\' 9"  (1.753 m)  Wt 118 lb (53.524 kg)  BMI 17.42 kg/m2  SpO2 99%  Physical Exam  Constitutional: He is oriented to person, place, and time. No distress.  Neck: No tracheal deviation present.  Cardiovascular: Normal rate and regular rhythm.  Exam reveals no gallop and no friction rub.   No murmur heard. Pulmonary/Chest: Effort normal and breath sounds normal. No respiratory distress. He has no wheezes. He has no rales.  Neurological: He is alert and oriented to person, place, and time.  Skin: He is not diaphoretic.  Face prosthesis     Mallampati Score:  MD Evaluation Airway: WNL Heart: WNL Abdomen: WNL Chest/ Lungs: WNL ASA  Classification: 3 Mallampati/Airway Score: Two  Imaging: No results found.  Labs:  CBC:  Recent Labs  10/28/14 2302 10/29/14 0610 01/18/15 1539 01/31/15 0800  WBC 15.4*  --  20.3* 22.7*  HGB 11.2* 11.6* 8.5* 8.4*  HCT 34.8* 34.0* 27.8* 27.5*  PLT 412*  --  547* 618*    COAGS:  Recent Labs  01/31/15 0800  INR 1.09  APTT 34    BMP:  Recent Labs  10/28/14 2302 10/29/14 0610 01/18/15 1539  NA 130* 132* 136  K 5.5* 4.0 4.0  CL 88* 95*  --   CO2 23  --  24  GLUCOSE 98 127* 112  BUN 13 9 19.7  CALCIUM 9.8  --  9.1  CREATININE 0.98 0.90 1.2  GFRNONAA 85*  --   --   GFRAA >90  --   --     LIVER FUNCTION TESTS:  Recent Labs  10/28/14 2302 01/18/15 1539  BILITOT 0.4 0.29  AST 12 11  ALT 10 8  ALKPHOS 124* 111  PROT 8.2 7.7  ALBUMIN 3.5 3.0*   Assessment and Plan: Recurrent invasive squamous cell carcinoma S/p surgical, chemotherapy and radiation therapy. Seen by Dr. Alvy Bimler 01/19/15 Scheduled for image guided port a  catheter with moderate sedation  Patient has been NPO, no blood thinner taken. Leukocytosis trending up with neutrophilia and history of sinus/prosthesis infection taking clindamycin last 5 days that was a refill from previous infection. With concern for uncontrolled infection, we will hold off on a port a catheter, this has been discussed with Dr. Vernard Gambles and Dr. Alvy Bimler today as well as the patient and his family.  He will be rescheduled for a port a catheter within the next 3 weeks and has a scheduled appt for tomorrow to see Dr. Alvy Bimler who will review his labs and address infection concerns.  Anemia of neoplastic disease Hearing loss Blindness of right eye.    Thank you for this interesting consult.  I greatly enjoyed meeting Timothy Stevenson and look forward to participating in their care.  SignedHedy Jacob 01/31/2015, 8:39 AM   I spent a total of 20 Minutes in face to face in clinical consultation, greater than 50% of which was counseling/coordinating care for recurrent invasive squamous cell carcinoma.

## 2015-01-31 NOTE — Progress Notes (Signed)
Procedure cancelled in radiology , pt was previously scheduled for a PET SCAN,( pt taken to radiology waiting room, per instructions of nuclear med ) to be taken to nuclear med by their staff,.

## 2015-01-31 NOTE — Progress Notes (Signed)
Met with patient and his wife in [facility] Radiology where he was waiting for PET appt.    I informed them that the necessary arrangements were being completed for tomorrow's delivery of Keytruda for his Thursday infusion.  Wife indicated husband needed OxyContin refill.  I informed Dr. Alvy Bimler, delivered Rx to them in Radiology.  They understand I will be out of the office for the remainder of the week and that they should contact Dr. Calton Dach RN at [phone] with questions/needs.  Gayleen Orem, RN, BSN, [facility] at [place] [phone]

## 2015-02-01 ENCOUNTER — Ambulatory Visit (HOSPITAL_BASED_OUTPATIENT_CLINIC_OR_DEPARTMENT_OTHER)
Admission: RE | Admit: 2015-02-01 | Discharge: 2015-02-01 | Disposition: A | Discharge status: Home / Self Care | Payer: Medicare Other | Source: Ambulatory Visit | Attending: Hematology and Oncology | Admitting: Hematology and Oncology

## 2015-02-01 ENCOUNTER — Other Ambulatory Visit (HOSPITAL_BASED_OUTPATIENT_CLINIC_OR_DEPARTMENT_OTHER): Payer: Medicare Other

## 2015-02-01 ENCOUNTER — Ambulatory Visit (HOSPITAL_BASED_OUTPATIENT_CLINIC_OR_DEPARTMENT_OTHER): Payer: Medicare Other

## 2015-02-01 ENCOUNTER — Ambulatory Visit (HOSPITAL_BASED_OUTPATIENT_CLINIC_OR_DEPARTMENT_OTHER): Payer: Medicare Other | Admitting: Hematology and Oncology

## 2015-02-01 ENCOUNTER — Other Ambulatory Visit: Payer: Self-pay | Admitting: Hematology and Oncology

## 2015-02-01 ENCOUNTER — Ambulatory Visit (HOSPITAL_COMMUNITY)
Admission: RE | Admit: 2015-02-01 | Discharge: 2015-02-01 | Disposition: A | Discharge status: Home / Self Care | Payer: Medicare Other | Source: Ambulatory Visit | Attending: Hematology and Oncology | Admitting: Hematology and Oncology

## 2015-02-01 ENCOUNTER — Telehealth: Payer: Self-pay | Admitting: Hematology and Oncology

## 2015-02-01 VITALS — BP 128/79 | HR 77 | Temp 97.6°F | Resp 18 | Ht 69.0 in | Wt 117.9 lb

## 2015-02-01 VITALS — BP 136/65 | HR 67 | Temp 98.0°F | Resp 18

## 2015-02-01 DIAGNOSIS — D649 Anemia, unspecified: Secondary | ICD-10-CM | POA: Insufficient documentation

## 2015-02-01 DIAGNOSIS — C44321 Squamous cell carcinoma of skin of nose: Secondary | ICD-10-CM

## 2015-02-01 DIAGNOSIS — Z91041 Radiographic dye allergy status: Secondary | ICD-10-CM | POA: Insufficient documentation

## 2015-02-01 DIAGNOSIS — R220 Localized swelling, mass and lump, head: Secondary | ICD-10-CM | POA: Diagnosis not present

## 2015-02-01 DIAGNOSIS — K521 Toxic gastroenteritis and colitis: Secondary | ICD-10-CM

## 2015-02-01 DIAGNOSIS — D630 Anemia in neoplastic disease: Secondary | ICD-10-CM | POA: Diagnosis not present

## 2015-02-01 DIAGNOSIS — E860 Dehydration: Secondary | ICD-10-CM

## 2015-02-01 DIAGNOSIS — R42 Dizziness and giddiness: Secondary | ICD-10-CM | POA: Insufficient documentation

## 2015-02-01 DIAGNOSIS — N19 Unspecified kidney failure: Secondary | ICD-10-CM | POA: Diagnosis not present

## 2015-02-01 DIAGNOSIS — R51 Headache: Secondary | ICD-10-CM | POA: Insufficient documentation

## 2015-02-01 DIAGNOSIS — G893 Neoplasm related pain (acute) (chronic): Secondary | ICD-10-CM

## 2015-02-01 LAB — CBC WITH DIFFERENTIAL/PLATELET
BASO%: 0.2 % (ref 0.0–2.0)
Basophils Absolute: 0.1 10*3/uL (ref 0.0–0.1)
EOS%: 2.8 % (ref 0.0–7.0)
Eosinophils Absolute: 0.8 10*3/uL — ABNORMAL HIGH (ref 0.0–0.5)
HCT: 25.8 % — ABNORMAL LOW (ref 38.4–49.9)
HGB: 7.9 g/dL — ABNORMAL LOW (ref 13.0–17.1)
LYMPH#: 1.4 10*3/uL (ref 0.9–3.3)
LYMPH%: 5 % — ABNORMAL LOW (ref 14.0–49.0)
MCH: 26.4 pg — AB (ref 27.2–33.4)
MCHC: 30.6 g/dL — AB (ref 32.0–36.0)
MCV: 86.3 fL (ref 79.3–98.0)
MONO#: 1.4 10*3/uL — ABNORMAL HIGH (ref 0.1–0.9)
MONO%: 5.2 % (ref 0.0–14.0)
NEUT#: 23.2 10*3/uL — ABNORMAL HIGH (ref 1.5–6.5)
NEUT%: 86.8 % — ABNORMAL HIGH (ref 39.0–75.0)
Platelets: 527 10*3/uL — ABNORMAL HIGH (ref 140–400)
RBC: 2.99 10*6/uL — ABNORMAL LOW (ref 4.20–5.82)
RDW: 16.2 % — AB (ref 11.0–14.6)
WBC: 26.8 10*3/uL — ABNORMAL HIGH (ref 4.0–10.3)

## 2015-02-01 LAB — COMPREHENSIVE METABOLIC PANEL (CC13)
ALT: 7 U/L (ref 0–55)
AST: 10 U/L (ref 5–34)
Albumin: 2.9 g/dL — ABNORMAL LOW (ref 3.5–5.0)
Alkaline Phosphatase: 104 U/L (ref 40–150)
Anion Gap: 13 mEq/L — ABNORMAL HIGH (ref 3–11)
BUN: 35.3 mg/dL — ABNORMAL HIGH (ref 7.0–26.0)
CALCIUM: 8.9 mg/dL (ref 8.4–10.4)
CO2: 25 mEq/L (ref 22–29)
Chloride: 98 mEq/L (ref 98–109)
Creatinine: 2.1 mg/dL — ABNORMAL HIGH (ref 0.7–1.3)
EGFR: 32 mL/min/{1.73_m2} — ABNORMAL LOW (ref >90)
Glucose: 88 mg/dl (ref 70–140)
Potassium: 4.2 mEq/L (ref 3.5–5.1)
Sodium: 136 mEq/L (ref 136–145)
Total Bilirubin: <0.2 mg/dL (ref 0.20–1.20)
Total Protein: 7.3 g/dL (ref 6.4–8.3)

## 2015-02-01 LAB — IRON AND TIBC CHCC
Iron: <11 ug/dL — ABNORMAL LOW (ref 42–163)
TIBC: 186 ug/dL — ABNORMAL LOW (ref 202–409)

## 2015-02-01 LAB — TSH CHCC: TSH: 0.987 m(IU)/L (ref 0.320–4.118)

## 2015-02-01 LAB — FERRITIN CHCC: Ferritin: 192 ng/ml (ref 22–316)

## 2015-02-01 LAB — ABO/RH: ABO/RH(D): O POS

## 2015-02-01 LAB — HOLD TUBE, BLOOD BANK

## 2015-02-01 MED ORDER — SODIUM CHLORIDE 0.9 % IV SOLN
Freq: Once | INTRAVENOUS | Status: AC
  Administered 2015-02-01: 17:00:00 via INTRAVENOUS

## 2015-02-01 MED ORDER — OXYCODONE HCL 30 MG PO TABS: 30.0000 mg | ORAL_TABLET | ORAL | Status: AC | PRN

## 2015-02-01 MED ORDER — OLANZAPINE 5 MG PO TABS: 5.0000 mg | ORAL_TABLET | Freq: Every day | ORAL | Status: AC

## 2015-02-01 MED ORDER — OMEPRAZOLE 20 MG PO CPDR: 20.0000 mg | DELAYED_RELEASE_CAPSULE | Freq: Every day | ORAL | Status: DC

## 2015-02-01 MED ORDER — SODIUM CHLORIDE 0.9 % IV SOLN: 250.0000 mL | Freq: Once | INTRAVENOUS | Status: DC

## 2015-02-01 MED ORDER — ONDANSETRON HCL 8 MG PO TABS: 8.0000 mg | ORAL_TABLET | Freq: Three times a day (TID) | ORAL | Status: AC | PRN

## 2015-02-01 NOTE — Patient Instructions (Signed)

## 2015-02-01 NOTE — Telephone Encounter (Signed)
gv and printed appt sched and avs forpt for Feb adn March....sed added tx.    °

## 2015-02-02 ENCOUNTER — Ambulatory Visit (HOSPITAL_BASED_OUTPATIENT_CLINIC_OR_DEPARTMENT_OTHER): Payer: Medicare Other

## 2015-02-02 ENCOUNTER — Ambulatory Visit: Payer: Medicare Other | Admitting: Nutrition

## 2015-02-02 DIAGNOSIS — N19 Unspecified kidney failure: Secondary | ICD-10-CM | POA: Insufficient documentation

## 2015-02-02 DIAGNOSIS — C44321 Squamous cell carcinoma of skin of nose: Secondary | ICD-10-CM

## 2015-02-02 DIAGNOSIS — Z5112 Encounter for antineoplastic immunotherapy: Secondary | ICD-10-CM

## 2015-02-02 DIAGNOSIS — E860 Dehydration: Secondary | ICD-10-CM | POA: Insufficient documentation

## 2015-02-02 DIAGNOSIS — K521 Toxic gastroenteritis and colitis: Secondary | ICD-10-CM | POA: Insufficient documentation

## 2015-02-02 LAB — TYPE AND SCREEN
ABO/RH(D): O POS
Antibody Screen: NEGATIVE
Unit division: 0

## 2015-02-02 LAB — SEDIMENTATION RATE: Sed Rate: 63 mm/hr — ABNORMAL HIGH (ref 0–20)

## 2015-02-02 MED ORDER — PROCHLORPERAZINE MALEATE 10 MG PO TABS
ORAL_TABLET | ORAL | Status: AC
  Filled 2015-02-02: qty 1

## 2015-02-02 MED ORDER — SODIUM CHLORIDE 0.9 % IV SOLN
Freq: Once | INTRAVENOUS | Status: AC
  Administered 2015-02-02: 13:00:00 via INTRAVENOUS

## 2015-02-02 MED ORDER — PROCHLORPERAZINE MALEATE 10 MG PO TABS
10.0000 mg | ORAL_TABLET | Freq: Once | ORAL | Status: AC
  Administered 2015-02-02: 10 mg via ORAL

## 2015-02-02 MED ORDER — SODIUM CHLORIDE 0.9 % IV SOLN
2.0000 mg/kg | Freq: Once | INTRAVENOUS | Status: AC
  Administered 2015-02-02: 100 mg via INTRAVENOUS
  Filled 2015-02-02: qty 4

## 2015-02-02 NOTE — Assessment & Plan Note (Signed)
This is related to dehydration and the use of recent naproxen. I recommend IV fluids daily and recheck his kidney function at the end of the week.

## 2015-02-02 NOTE — Assessment & Plan Note (Signed)
I refill his pressure and pain medicine. We discussed about narcotic refill policy.

## 2015-02-02 NOTE — Assessment & Plan Note (Signed)
We discussed the role of chemotherapy. The intent is for strictly palliative with no concrete data it will work in his situation although some sporadic, rare cases suggested some benefit. The patient is receiving drug treatment free of charge via the drug company through compassionate care program.  We discussed some of the risks, benefits, side-effects of Pembrolizumab.  Some of the short term side-effects included, though not limited to, risk of fatigue, pancytopenia, life-threatening infections, allergic reactions, nausea, vomiting, sores in the mouth, changes in bowel habits especially diarrhea, admission to hospital for various reasons, and risks of death.   The patient is aware that the response rates discussed earlier is not guaranteed.    After a long discussion, patient made an informed decision to proceed with the prescribed plan of care. We will proceed with treatment as scheduled. Due to possible infection, the port placement was delayed. We will use peripheral venous access for now.

## 2015-02-02 NOTE — Assessment & Plan Note (Signed)
We discussed some of the risks, benefits, and alternatives of blood transfusions. The patient is symptomatic from anemia and the hemoglobin level is critically low.  Some of the side-effects to be expected including risks of transfusion reactions, chills, infection, syndrome of volume overload and risk of hospitalization from various reasons and the patient is willing to proceed and went ahead to sign consent today.  

## 2015-02-02 NOTE — Assessment & Plan Note (Signed)
The patient was placed on antibiotics for possible infection. I suspect, the leukocytosis and the persistent chronic discharge is related to necrotic tumor. He is not benefiting clinically from antibiotic therapy and is causing diarrhea and as a result of diarrhea causing dehydration and acute renal failure. I recommend he stop clindamycin antibiotic treatment now.

## 2015-02-02 NOTE — Progress Notes (Signed)
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  He is a patient of Dr. Alvy Bimler.  Past medical history includes hypertension, GERD, anemia, sleep apnea, and bilateral hearing loss.  Medications include milk of magnesia, Remeron, Prilosec, Zofran, and MiraLAX.  Labs were reviewed.  Height: 69 inches. Weight: 117.9 pounds. Usual body weight: 165 pounds 2 years ago.  139 pounds November 2015. BMI: 17.4.  Patient was identified to be at risk for malnutrition on the MST secondary to weight loss and poor appetite.  Met with patient and wife, during chemotherapy.   Patient consume soft foods secondary to difficulty chewing and swallowing. He enjoys oral nutrition supplements and drinks one to 2 daily. Patient reports diarrhea occurred after after he was given antibiotics however this has improved in the last 24 hours. Patient verbalizes desire to gain weight.  Patient meets criteria for severe malnutrition in the context of chronic illness secondary to 15% weight loss in 3 months, less than 75% energy intake for greater than one month and severe depletion of body fat and muscle mass on physical exam.  Nutrition diagnosis: Unintended weight loss related to inadequate oral intake as evidenced by 48 pound weight loss from usual body weight.  Intervention: Educated patient to increase calories and protein while consuming meals and snacks 6 times daily. Recommended patient increase oral nutrition supplements 3 times a day. Provided samples and coupons for patient. Reviewed foods to help improve diarrhea. Provided fact sheets.  Provided recipes for patient's wife to make shakes. Questions were answered.  Teach back method used.  Contact information given.  Monitoring, evaluation, goals: Patient will work to increase oral intake to improve quality-of-life.  Next visit: Friday, March 11, during chemotherapy.  **Disclaimer: This note was  dictated with voice recognition software. Similar sounding words can inadvertently be transcribed and this note may contain transcription errors which may not have been corrected upon publication of note.**

## 2015-02-02 NOTE — Progress Notes (Signed)
[place] OFFICE PROGRESS NOTE  Patient Care Team: Provider Not In System as PCP - General Heath Lark, MD as Consulting Physician (Hematology and Oncology) Joya Martyr, MD as Referring Physician (Internal Medicine) Brooks Sailors, RN as Oncology Nurse Navigator  SUMMARY OF ONCOLOGIC HISTORY:   Squamous cell cancer of skin of ala nasi   12/20/2007 Surgery He had Mohs surgery to his face. Pathology showed squamous cell carcinoma   01/19/2009 Surgery He had repeat Mohs surgery to the face   05/19/2012 Surgery Left Caldwell-luc approach left infraorbital nerve with selective left neck LN dissection   05/06/2013 Surgery he has repeat nasal endoscopy with extensive sinus surgery, nose removal and reconstruction surgery   05/14/2013 Surgery he had exenteration of left orbital content, parotid dissection, facial nerve removal, radical neck dissection and free flap reconstruction   06/01/2013 Surgery he had reconstruction surgery and repair of CSF leak   07/06/2013 Surgery He had cranial procedure to repeat CSF leak   07/19/2013 - 08/30/2013 Radiation Therapy he had concurrent chemo/RT   07/19/2013 - 08/30/2013 Chemotherapy he had concurrent chemo/RT with cisplatin   09/29/2013 Imaging PET/CT scan showed residual disease in sunus bone and hypermetabolic LN in the right neck   12/08/2013 Surgery He had repeat nasal endoscopy and debridement   12/14/2013 Pathology Results Repeat sinus biopsy at [place] confirmed recurrence of squamous cell carcinoma   03/21/2014 - 09/19/2014 Chemotherapy He received erlotinib at 75 mg/m2   05/03/2014 Imaging Ct sinus showed progression of disease   09/20/2014 Imaging CT sinus & MRI brain showed soft tissue thickening in sinuses worrisome of disease recurrence   11/14/2014 - 11/14/2014 Chemotherapy He received only 1 dose of docetaxel   01/31/2015 Imaging PET CT scan show extensive cranial involvement   02/01/2015 Imaging MRI of the brain show possible extension to the  frontal lobe   02/02/2015 -  Chemotherapy Patient is started on palliative Pembrolizumab via of compassionate care program.    INTERVAL HISTORY: Please see below for problem oriented charting. He is seen to review test results and prior to the start of treatment. Unfortunately, due to leukocytosis and persistent discharge from his nasal cavity, port placement was delayed. He was placed on antibiotic therapy with no perceived benefit. He is having increasing diarrhea since the antibiotic treatment. He denies abdominal cramping or hematochezia. Denies fevers or chills. He continues to have persisted, active bleeding from the nasal cavity.  REVIEW OF SYSTEMS:   Constitutional: Denies fevers, chills or abnormal weight loss Eyes: Denies blurriness of vision Ears, nose, mouth, throat, and face: Denies mucositis or sore throat Respiratory: Denies cough, dyspnea or wheezes Cardiovascular: Denies palpitation, chest discomfort or lower extremity swelling Skin: Denies abnormal skin rashes Lymphatics: Denies new lymphadenopathy or easy bruising Neurological:Denies numbness, tingling or new weaknesses Behavioral/Psych: Mood is stable, no new changes  All other systems were reviewed with the patient and are negative.  I have reviewed the past medical history, past surgical history, social history and family history with the patient and they are unchanged from previous note.  ALLERGIES:  is allergic to aquaphor; ivp dye; and triple antibiotic.  MEDICATIONS:  Current Outpatient Prescriptions  Medication Sig Dispense Refill  . acetaminophen (TYLENOL) 500 MG tablet Take 1,000 mg by mouth every 6 (six) hours as needed for mild pain.    Marland Kitchen CLINDAMYCIN HCL PO Take by mouth.    . diphenhydramine-acetaminophen (TYLENOL PM) 25-500 MG TABS Take 2 tablets by mouth at bedtime as needed (pain/sleep).    Marland Kitchen  ferrous fumarate (HEMOCYTE - 106 MG FE) 325 (106 FE) MG TABS tablet Take 1 tablet by mouth at bedtime.    .  lidocaine-prilocaine (EMLA) cream Apply 1 application topically as needed. 30 g 1  . Magnesium Hydroxide (MILK OF MAGNESIA PO) Take 30 mLs by mouth daily as needed (constipation).     . mirtazapine (REMERON) 15 MG tablet Take 7.5 mg by mouth at bedtime.    . naproxen sodium (ANAPROX) 220 MG tablet Take 220 mg by mouth as needed. Takes 5 tablets once daily as needed for gout    . OLANZapine (ZYPREXA) 5 MG tablet Take 1 tablet (5 mg total) by mouth at bedtime. 60 tablet 3  . omeprazole (PRILOSEC) 20 MG capsule Take 1 capsule (20 mg total) by mouth daily. 90 capsule 3  . ondansetron (ZOFRAN) 8 MG tablet Take 1 tablet (8 mg total) by mouth every 8 (eight) hours as needed for nausea or vomiting. 60 tablet 3  . OxyCODONE (OXYCONTIN) 20 mg T12A 12 hr tablet Take 1 tablet (20 mg total) by mouth every 12 (twelve) hours. 60 tablet 0  . oxycodone (ROXICODONE) 30 MG immediate release tablet Take 1 tablet (30 mg total) by mouth every 4 (four) hours as needed for pain. 90 tablet 0  . Polyethylene Glycol 3350 (MIRALAX PO) Take 1 packet by mouth daily as needed (constipation).      No current facility-administered medications for this visit.    PHYSICAL EXAMINATION: ECOG PERFORMANCE STATUS: 1 - Symptomatic but completely ambulatory  Filed Vitals:   02/01/15 1153  BP: 128/79  Pulse: 77  Temp: 97.6 F (36.4 C)  Resp: 18   Filed Weights   02/01/15 1153  Weight: 117 lb 14.4 oz (53.479 kg)    GENERAL:alert, no distress and comfortable SKIN: skin color, texture, turgor are normal, no rashes or significant lesions EYES: There is persistent drainage coming from the right eye.  OROPHARYNX: Significant nasal deformity from prior surgery. NECK: supple, thyroid normal size, non-tender, without nodularity NEURO: alert & oriented x 3 with fluent speech, no focal motor/sensory deficits  LABORATORY DATA:  I have reviewed the data as listed    Component Value Date/Time   NA 136 02/01/2015 1129   NA 132*  10/29/2014 0610   K 4.2 02/01/2015 1129   K 4.0 10/29/2014 0610   CL 95* 10/29/2014 0610   CO2 25 02/01/2015 1129   CO2 23 10/28/2014 2302   GLUCOSE 88 02/01/2015 1129   GLUCOSE 127* 10/29/2014 0610   BUN 35.3* 02/01/2015 1129   BUN 9 10/29/2014 0610   CREATININE 2.1* 02/01/2015 1129   CREATININE 0.90 10/29/2014 0610   CALCIUM 8.9 02/01/2015 1129   CALCIUM 9.8 10/28/2014 2302   PROT 7.3 02/01/2015 1129   PROT 8.2 10/28/2014 2302   ALBUMIN 2.9* 02/01/2015 1129   ALBUMIN 3.5 10/28/2014 2302   AST 10 02/01/2015 1129   AST 12 10/28/2014 2302   ALT 7 02/01/2015 1129   ALT 10 10/28/2014 2302   ALKPHOS 104 02/01/2015 1129   ALKPHOS 124* 10/28/2014 2302   BILITOT <0.20 02/01/2015 1129   BILITOT 0.4 10/28/2014 2302   GFRNONAA 85* 10/28/2014 2302   GFRAA >90 10/28/2014 2302    No results found for: SPEP, UPEP  Lab Results  Component Value Date   WBC 26.8* 02/01/2015   NEUTROABS 23.2* 02/01/2015   HGB 7.9* 02/01/2015   HCT 25.8* 02/01/2015   MCV 86.3 02/01/2015   PLT 527* 02/01/2015  Chemistry      Component Value Date/Time   NA 136 02/01/2015 1129   NA 132* 10/29/2014 0610   K 4.2 02/01/2015 1129   K 4.0 10/29/2014 0610   CL 95* 10/29/2014 0610   CO2 25 02/01/2015 1129   CO2 23 10/28/2014 2302   BUN 35.3* 02/01/2015 1129   BUN 9 10/29/2014 0610   CREATININE 2.1* 02/01/2015 1129   CREATININE 0.90 10/29/2014 0610      Component Value Date/Time   CALCIUM 8.9 02/01/2015 1129   CALCIUM 9.8 10/28/2014 2302   ALKPHOS 104 02/01/2015 1129   ALKPHOS 124* 10/28/2014 2302   AST 10 02/01/2015 1129   AST 12 10/28/2014 2302   ALT 7 02/01/2015 1129   ALT 10 10/28/2014 2302   BILITOT <0.20 02/01/2015 1129   BILITOT 0.4 10/28/2014 2302       RADIOGRAPHIC STUDIES: I have personally reviewed the radiological images as listed and agreed with the findings in the report. Mr Brain Wo Contrast  02/01/2015   CLINICAL DATA:  History of squamous cell cancer of the nose with  recurrence. Headache and dizziness.  EXAM: MRI HEAD WITHOUT CONTRAST  TECHNIQUE: Multiplanar, multiecho pulse sequences of the brain and surrounding structures were obtained without intravenous contrast.  COMPARISON:  PET-CT 01/31/2015  FINDINGS: Extensive facial resection including the nose, left globe, ethmoid sinuses. Air-fluid in the left maxillary sinus. Extensive mucosal thickening in the right maxillary sinus. Bilateral mastoid sinus effusion.  Intravenous contrast not given due to contrast allergy (anaphylaxis). This significantly limits ability to detect recurrent tumor.  Lobular soft tissue is present in the left orbit and left side of the surgical resection site, likely related to tumor. There is mild asymmetry of the left cavernous sinus which could be due to tumor extension. There is abnormal signal in the sphenoid sinus and clivus which could be due to tumor or chronic sinusitis.  There is a small amount of edema in the left inferior frontal lobe just above the cribriform plate, worrisome for intracranial tumor extension. This would be best evaluated with contrast however I suspect there is approximate 1 cm mass in the left inferior frontal lobe compatible with tumor. There appears to be some dural thickening in the area.  Ventricle size is normal. Mild atrophy is present. Negative for acute infarct. Scattered small white matter hyperintensities consistent with microvascular ischemia. Negative for intracranial hemorrhage.  IMPRESSION: Extensive surgical resection of the nose, ethmoid sinuses and left globe for cancer surgery.  Intravenous contrast not administered due to allergy.  Extensive lobular mass in the left orbit and to the left of the resection cavity, highly suspicious for local tumor. There is concern of tumor in the left cavernous sinus. There is high likelihood of intracranial extension of tumor into the left inferior frontal lobe with white matter edema in the adjacent frontal lobe. There  is possible tumor in the clivus although this could be due to sphenoid sinusitis.   Electronically Signed   By: Franchot Gallo M.D.   On: 02/01/2015 09:32   Nm Pet Image Restag (ps) Skull Base To Thigh  01/31/2015   CLINICAL DATA:  Subsequent treatment strategy for squamous cell cancer of the head neck.  EXAM: NUCLEAR MEDICINE PET SKULL BASE TO THIGH  TECHNIQUE: 5.93 mCi F-18 FDG was injected intravenously. Full-ring PET imaging was performed from the skull base to thigh after the radiotracer. CT data was obtained and used for attenuation correction and anatomic localization.  FASTING BLOOD GLUCOSE:  Value: 79 mg/dl  COMPARISON:  None.  FINDINGS: NECK  Extensive surgical changes involving the face. The nose and ethmoid sinuses have been resected along with the medial walls of the maxillary sinuses and anterior walls of the sphenoid sinus. The medial orbital wall has been resected and the left globe has been resected. There is a fatty graft covering the left maxillary sinus and orbit. There is extensive tumor in the surgical bed greater on the left side involving the left orbit, left maxillary sinus, right maxillary sinus and medial aspect of the right orbit. SUV max is 8.0. There is also a small hypermetabolic subcutaneous nodule along the left maxilla which is metabolically active with SUV max of 4.2.  No neck adenopathy.  CHEST  Emphysematous changes are noted along with scarring changes. No metabolically active mediastinal or hilar lymph nodes. Vague ground-glass/semi-solid nodule in the right upper lobe is weakly hypermetabolic with SUV max of 8.54. Could not exclude adenocarcinoma.  4 mm peripheral right lower lobe pulmonary nodule on CT image number 96 is not metabolically active. There is also a 2 mm pulmonary nodule in the right upper lobe on image number 90. These are indeterminate but require followup.  ABDOMEN/PELVIS  No abnormal hypermetabolic activity within the liver, pancreas, adrenal glands, or  spleen. No hypermetabolic lymph nodes in the abdomen or pelvis.  Multiple renal cysts are noted. There are moderate to advanced atherosclerotic calcifications involving the aorta and iliac arteries.  The lower pelvis is somewhat obscured by bilateral hip prosthesis. Large amount of stool throughout the colon suggesting constipation.  SKELETON  Diffuse osseous uptake is likely due to rebound from chemotherapy or marrow stimulating drugs.  IMPRESSION: 1. Extensive facial neoplasm involving the orbits and sinuses. Extensive surgical changes are noted. 2. Small hypermetabolic left facial node near the maxilla. 3. No enlarged or hypermetabolic cervical adenopathy. 4. 14.5 mm semisolid nodule in the right upper lobe is weakly hypermetabolic. It could represent a focus of infection but a low-grade adenocarcinoma is also possible. There are also 2 smaller right lung nodules. Recommend close CT follow-up. 5. No findings for abdominal/pelvic metastatic disease. 6. Diffuse osseous uptake is likely due to rebound from chemotherapy or marrow stimulating drugs.   Electronically Signed   By: Marijo Sanes M.D.   On: 01/31/2015 13:03     ASSESSMENT & PLAN:  Squamous cell cancer of skin of ala nasi We discussed the role of chemotherapy. The intent is for strictly palliative with no concrete data it will work in his situation although some sporadic, rare cases suggested some benefit. The patient is receiving drug treatment free of charge via the drug company through compassionate care program.  We discussed some of the risks, benefits, side-effects of Pembrolizumab.  Some of the short term side-effects included, though not limited to, risk of fatigue, pancytopenia, life-threatening infections, allergic reactions, nausea, vomiting, sores in the mouth, changes in bowel habits especially diarrhea, admission to hospital for various reasons, and risks of death.   The patient is aware that the response rates discussed earlier is  not guaranteed.    After a long discussion, patient made an informed decision to proceed with the prescribed plan of care. We will proceed with treatment as scheduled. Due to possible infection, the port placement was delayed. We will use peripheral venous access for now.   Anemia in neoplastic disease We discussed some of the risks, benefits, and alternatives of blood transfusions. The patient is symptomatic from anemia and the hemoglobin level  is critically low.  Some of the side-effects to be expected including risks of transfusion reactions, chills, infection, syndrome of volume overload and risk of hospitalization from various reasons and the patient is willing to proceed and went ahead to sign consent today.   Cancer associated pain I refill his pressure and pain medicine. We discussed about narcotic refill policy.   Diarrhea due to drug The patient was placed on antibiotics for possible infection. I suspect, the leukocytosis and the persistent chronic discharge is related to necrotic tumor. He is not benefiting clinically from antibiotic therapy and is causing diarrhea and as a result of diarrhea causing dehydration and acute renal failure. I recommend he stop clindamycin antibiotic treatment now.   Dehydration This is related to diarrhea. I recommend IV fluids daily.   Prerenal renal failure This is related to dehydration and the use of recent naproxen. I recommend IV fluids daily and recheck his kidney function at the end of the week.    Orders Placed This Encounter  Procedures  . Hold Tube, Blood Bank    Standing Status: Standing     Number of Occurrences: 9     Standing Expiration Date: 02/02/2016   All questions were answered. The patient knows to call the clinic with any problems, questions or concerns. No barriers to learning was detected. I spent 40 minutes counseling the patient face to face. The total time spent in the appointment was 55 minutes and more than  50% was on counseling and review of test results     [facility], [place], MD 02/02/2015 7:51 AM

## 2015-02-02 NOTE — Patient Instructions (Signed)
[facility] Discharge Instructions for Patients Receiving Chemotherapy  Today you received the following chemotherapy agents Keytruda  To help prevent nausea and vomiting after your treatment, we encourage you to take your nausea medication as needed   If you develop nausea and vomiting that is not controlled by your nausea medication, call the clinic.   BELOW ARE SYMPTOMS THAT SHOULD BE REPORTED IMMEDIATELY:  *FEVER GREATER THAN 100.5 F  *CHILLS WITH OR WITHOUT FEVER  NAUSEA AND VOMITING THAT IS NOT CONTROLLED WITH YOUR NAUSEA MEDICATION  *UNUSUAL SHORTNESS OF BREATH  *UNUSUAL BRUISING OR BLEEDING  TENDERNESS IN MOUTH AND THROAT WITH OR WITHOUT PRESENCE OF ULCERS  *URINARY PROBLEMS  *BOWEL PROBLEMS  UNUSUAL RASH Items with * indicate a potential emergency and should be followed up as soon as possible.  Feel free to call the clinic you have any questions or concerns. The clinic phone number is [phone].

## 2015-02-02 NOTE — Assessment & Plan Note (Signed)
This is related to diarrhea. I recommend IV fluids daily.

## 2015-02-03 ENCOUNTER — Telehealth: Payer: Self-pay | Admitting: *Deleted

## 2015-02-03 ENCOUNTER — Other Ambulatory Visit (HOSPITAL_BASED_OUTPATIENT_CLINIC_OR_DEPARTMENT_OTHER): Payer: Medicare Other

## 2015-02-03 ENCOUNTER — Other Ambulatory Visit: Payer: Self-pay | Admitting: *Deleted

## 2015-02-03 DIAGNOSIS — D630 Anemia in neoplastic disease: Secondary | ICD-10-CM

## 2015-02-03 DIAGNOSIS — C44321 Squamous cell carcinoma of skin of nose: Secondary | ICD-10-CM

## 2015-02-03 LAB — CBC WITH DIFFERENTIAL/PLATELET
BASO%: 0.4 % (ref 0.0–2.0)
Basophils Absolute: 0.1 10*3/uL (ref 0.0–0.1)
EOS%: 2.7 % (ref 0.0–7.0)
Eosinophils Absolute: 0.5 10*3/uL (ref 0.0–0.5)
HEMATOCRIT: 31.8 % — AB (ref 38.4–49.9)
HGB: 10 g/dL — ABNORMAL LOW (ref 13.0–17.1)
LYMPH%: 5 % — ABNORMAL LOW (ref 14.0–49.0)
MCH: 26.8 pg — ABNORMAL LOW (ref 27.2–33.4)
MCHC: 31.4 g/dL — ABNORMAL LOW (ref 32.0–36.0)
MCV: 85.3 fL (ref 79.3–98.0)
MONO#: 1 10*3/uL — AB (ref 0.1–0.9)
MONO%: 5.3 % (ref 0.0–14.0)
NEUT#: 16.1 10*3/uL — ABNORMAL HIGH (ref 1.5–6.5)
NEUT%: 86.6 % — ABNORMAL HIGH (ref 39.0–75.0)
NRBC: 0 % (ref 0–0)
PLATELETS: 554 10*3/uL — AB (ref 140–400)
RBC: 3.73 10*6/uL — AB (ref 4.20–5.82)
RDW: 15.8 % — ABNORMAL HIGH (ref 11.0–14.6)
WBC: 18.6 10*3/uL — ABNORMAL HIGH (ref 4.0–10.3)
lymph#: 0.9 10*3/uL (ref 0.9–3.3)

## 2015-02-03 LAB — COMPREHENSIVE METABOLIC PANEL (CC13)
ALBUMIN: 2.7 g/dL — AB (ref 3.5–5.0)
ALK PHOS: 110 U/L (ref 40–150)
ALT: 6 U/L (ref 0–55)
AST: 10 U/L (ref 5–34)
Anion Gap: 9 mEq/L (ref 3–11)
BUN: 22 mg/dL (ref 7.0–26.0)
CO2: 24 mEq/L (ref 22–29)
CREATININE: 1.1 mg/dL (ref 0.7–1.3)
Calcium: 8.8 mg/dL (ref 8.4–10.4)
Chloride: 103 mEq/L (ref 98–109)
EGFR: 69 mL/min/{1.73_m2} — ABNORMAL LOW (ref >90)
Glucose: 104 mg/dl (ref 70–140)
POTASSIUM: 4.8 meq/L (ref 3.5–5.1)
Sodium: 136 mEq/L (ref 136–145)
Total Bilirubin: 0.33 mg/dL (ref 0.20–1.20)
Total Protein: 6.6 g/dL (ref 6.4–8.3)

## 2015-02-03 LAB — HOLD TUBE, BLOOD BANK

## 2015-02-03 NOTE — Telephone Encounter (Signed)
Pt came in for labs today.  Copy of labs given to him and his wife.  Informed them his anemia and kidney failure are much improved now per Dr. Alvy Bimler.  Keep next appt as scheduled and call us if any changes or concerns before next visit.  They verbalized understanding.  Pt reports some mild nausea this morning.  No vomiting. No diarrhea or constipation. No fevers.  He is taking zofran as needed and says he is trying to drink plenty of fluid.

## 2015-02-21 ENCOUNTER — Other Ambulatory Visit: Payer: Self-pay | Admitting: Radiology

## 2015-02-22 ENCOUNTER — Encounter: Payer: Self-pay | Admitting: Hematology and Oncology

## 2015-02-22 ENCOUNTER — Other Ambulatory Visit: Payer: Self-pay | Admitting: Radiology

## 2015-02-23 ENCOUNTER — Other Ambulatory Visit: Payer: Self-pay | Admitting: *Deleted

## 2015-02-23 ENCOUNTER — Telehealth: Payer: Self-pay | Admitting: *Deleted

## 2015-02-23 ENCOUNTER — Other Ambulatory Visit: Payer: Medicare Other

## 2015-02-23 ENCOUNTER — Ambulatory Visit (HOSPITAL_COMMUNITY)
Admission: RE | Admit: 2015-02-23 | Discharge: 2015-02-23 | Disposition: A | Discharge status: Home / Self Care | Payer: Medicare Other | Source: Ambulatory Visit | Attending: Interventional Radiology | Admitting: Interventional Radiology

## 2015-02-23 ENCOUNTER — Encounter (HOSPITAL_COMMUNITY): Payer: Self-pay

## 2015-02-23 ENCOUNTER — Ambulatory Visit (HOSPITAL_COMMUNITY)
Admission: RE | Admit: 2015-02-23 | Discharge: 2015-02-23 | Disposition: A | Discharge status: Home / Self Care | Payer: Medicare Other | Source: Ambulatory Visit | Attending: Hematology and Oncology | Admitting: Hematology and Oncology

## 2015-02-23 ENCOUNTER — Encounter: Payer: Self-pay | Admitting: *Deleted

## 2015-02-23 VITALS — BP 126/55 | HR 58 | Temp 97.4°F | Resp 16

## 2015-02-23 DIAGNOSIS — Z9001 Acquired absence of eye: Secondary | ICD-10-CM | POA: Diagnosis not present

## 2015-02-23 DIAGNOSIS — N19 Unspecified kidney failure: Secondary | ICD-10-CM

## 2015-02-23 DIAGNOSIS — Z87891 Personal history of nicotine dependence: Secondary | ICD-10-CM | POA: Insufficient documentation

## 2015-02-23 DIAGNOSIS — I10 Essential (primary) hypertension: Secondary | ICD-10-CM | POA: Diagnosis not present

## 2015-02-23 DIAGNOSIS — C44321 Squamous cell carcinoma of skin of nose: Secondary | ICD-10-CM | POA: Diagnosis present

## 2015-02-23 DIAGNOSIS — K219 Gastro-esophageal reflux disease without esophagitis: Secondary | ICD-10-CM | POA: Diagnosis not present

## 2015-02-23 DIAGNOSIS — G4733 Obstructive sleep apnea (adult) (pediatric): Secondary | ICD-10-CM | POA: Insufficient documentation

## 2015-02-23 DIAGNOSIS — C7989 Secondary malignant neoplasm of other specified sites: Secondary | ICD-10-CM | POA: Diagnosis not present

## 2015-02-23 DIAGNOSIS — D638 Anemia in other chronic diseases classified elsewhere: Secondary | ICD-10-CM | POA: Diagnosis not present

## 2015-02-23 DIAGNOSIS — H540 Blindness, both eyes: Secondary | ICD-10-CM | POA: Diagnosis not present

## 2015-02-23 DIAGNOSIS — C7839 Secondary malignant neoplasm of other respiratory organs: Secondary | ICD-10-CM | POA: Diagnosis not present

## 2015-02-23 HISTORY — DX: Unspecified visual loss: H54.7

## 2015-02-23 HISTORY — DX: Unspecified disorder of nose and nasal sinuses: J34.9

## 2015-02-23 LAB — CBC WITH DIFFERENTIAL/PLATELET
BASOS ABS: 0 10*3/uL (ref 0.0–0.1)
Basophils Relative: 0 % (ref 0–1)
Eosinophils Absolute: 1.5 10*3/uL — ABNORMAL HIGH (ref 0.0–0.7)
Eosinophils Relative: 5 % (ref 0–5)
HCT: 27.9 % — ABNORMAL LOW (ref 39.0–52.0)
HEMOGLOBIN: 8.4 g/dL — AB (ref 13.0–17.0)
Lymphocytes Relative: 5 % — ABNORMAL LOW (ref 12–46)
Lymphs Abs: 1.5 10*3/uL (ref 0.7–4.0)
MCH: 25.7 pg — AB (ref 26.0–34.0)
MCHC: 30.1 g/dL (ref 30.0–36.0)
MCV: 85.3 fL (ref 78.0–100.0)
MONO ABS: 1.8 10*3/uL — AB (ref 0.1–1.0)
Monocytes Relative: 6 % (ref 3–12)
NEUTROS PCT: 84 % — AB (ref 43–77)
Neutro Abs: 25.9 10*3/uL — ABNORMAL HIGH (ref 1.7–7.7)
Platelets: 740 10*3/uL — ABNORMAL HIGH (ref 150–400)
RBC: 3.27 MIL/uL — AB (ref 4.22–5.81)
RDW: 15.7 % — ABNORMAL HIGH (ref 11.5–15.5)
WBC: 30.7 10*3/uL — AB (ref 4.0–10.5)

## 2015-02-23 LAB — COMPREHENSIVE METABOLIC PANEL
ALK PHOS: 137 U/L — AB (ref 39–117)
ALT: 14 U/L (ref 0–53)
AST: 19 U/L (ref 0–37)
Albumin: 2.5 g/dL — ABNORMAL LOW (ref 3.5–5.2)
Anion gap: 9 (ref 5–15)
BUN: 47 mg/dL — ABNORMAL HIGH (ref 6–23)
CHLORIDE: 99 mmol/L (ref 96–112)
CO2: 27 mmol/L (ref 19–32)
CREATININE: 2.15 mg/dL — AB (ref 0.50–1.35)
Calcium: 8.4 mg/dL (ref 8.4–10.5)
GFR, EST AFRICAN AMERICAN: 35 mL/min — AB (ref >90)
GFR, EST NON AFRICAN AMERICAN: 31 mL/min — AB (ref >90)
Glucose, Bld: 85 mg/dL (ref 70–99)
POTASSIUM: 5.1 mmol/L (ref 3.5–5.1)
Sodium: 135 mmol/L (ref 135–145)
TOTAL PROTEIN: 7.5 g/dL (ref 6.0–8.3)
Total Bilirubin: 0.5 mg/dL (ref 0.3–1.2)

## 2015-02-23 LAB — PROTIME-INR
INR: 1.22 (ref 0.00–1.49)
Prothrombin Time: 15.5 seconds — ABNORMAL HIGH (ref 11.6–15.2)

## 2015-02-23 LAB — TYPE AND SCREEN
ABO/RH(D): O POS
Antibody Screen: NEGATIVE

## 2015-02-23 LAB — TSH: TSH: 0.925 u[IU]/mL (ref 0.350–4.500)

## 2015-02-23 MED ORDER — MIDAZOLAM HCL 2 MG/2ML IJ SOLN
INTRAMUSCULAR | Status: AC
  Filled 2015-02-23: qty 6

## 2015-02-23 MED ORDER — MIDAZOLAM HCL 2 MG/2ML IJ SOLN
INTRAMUSCULAR | Status: AC | PRN
  Administered 2015-02-23: 0.5 mg via INTRAVENOUS

## 2015-02-23 MED ORDER — HEPARIN SOD (PORK) LOCK FLUSH 100 UNIT/ML IV SOLN
INTRAVENOUS | Status: AC | PRN
  Administered 2015-02-23: 500 [IU]

## 2015-02-23 MED ORDER — LIDOCAINE HCL 1 % IJ SOLN
INTRAMUSCULAR | Status: AC
  Filled 2015-02-23: qty 20

## 2015-02-23 MED ORDER — FENTANYL CITRATE 0.05 MG/ML IJ SOLN
INTRAMUSCULAR | Status: AC | PRN
  Administered 2015-02-23: 25 ug via INTRAVENOUS

## 2015-02-23 MED ORDER — FENTANYL CITRATE 0.05 MG/ML IJ SOLN
INTRAMUSCULAR | Status: AC
  Filled 2015-02-23: qty 4

## 2015-02-23 MED ORDER — LIDOCAINE-EPINEPHRINE 2 %-1:100000 IJ SOLN
INTRAMUSCULAR | Status: AC
  Filled 2015-02-23: qty 1

## 2015-02-23 MED ORDER — CEFAZOLIN SODIUM-DEXTROSE 2-3 GM-% IV SOLR
2.0000 g | Freq: Once | INTRAVENOUS | Status: AC
  Administered 2015-02-23: 2 g via INTRAVENOUS

## 2015-02-23 MED ORDER — SODIUM CHLORIDE 0.9 % IV SOLN
INTRAVENOUS | Status: DC
  Administered 2015-02-23: 09:00:00 via INTRAVENOUS

## 2015-02-23 MED ORDER — HEPARIN SOD (PORK) LOCK FLUSH 100 UNIT/ML IV SOLN
INTRAVENOUS | Status: AC
  Filled 2015-02-23: qty 5

## 2015-02-23 MED ORDER — SODIUM CHLORIDE 0.9 % IV SOLN
Freq: Once | INTRAVENOUS | Status: AC
  Administered 2015-02-23: 12:00:00 via INTRAVENOUS

## 2015-02-23 MED ORDER — CEFAZOLIN SODIUM-DEXTROSE 2-3 GM-% IV SOLR
INTRAVENOUS | Status: AC
  Filled 2015-02-23: qty 50

## 2015-02-23 NOTE — Telephone Encounter (Signed)
Labs drawn in pre op today.  Reviewed by Dr. Alvy Bimler.  She says pt does not need blood transfusion but does need IVFs due to elevated creatinine.   S/w [facility] at Short Stay and pt has already had aprox 500 cc IVFs and they can give him another 500 cc bolus before he is d/c'd today.

## 2015-02-23 NOTE — Progress Notes (Signed)
To provide support and encouragement, care continuity and to assess for needs, met with patient at [facility] prior to [facility] placement.  He was accompanied by his wife. Wife stated he has lost vision in R eye, there is increased clear drainage in the area of the prosthesis.  Pt and wife understand I will join them tomorrow during appt with Dr. Alvy Bimler.  Gayleen Orem, RN, BSN, [place] at [place] [phone]

## 2015-02-23 NOTE — Discharge Instructions (Signed)
Implanted Port Insertion, Care After Refer to this sheet in the next few weeks. These instructions provide you with information on caring for yourself after your procedure. Your health care provider may also give you more specific instructions. Your treatment has been planned according to current medical practices, but problems sometimes occur. Call your health care provider if you have any problems or questions after your procedure. WHAT TO EXPECT AFTER THE PROCEDURE After your procedure, it is typical to have the following:   Discomfort at the port insertion site. Ice packs to the area will help.  Bruising on the skin over the port. This will subside in 3-4 days. HOME CARE INSTRUCTIONS  After your port is placed, you will get a manufacturer's information card. The card has information about your port. Keep this card with you at all times.   Know what kind of port you have. There are many types of ports available.   Wear a medical alert bracelet in case of an emergency. This can help alert health care workers that you have a port.   The port can stay in for as long as your health care provider believes it is necessary.   A home health care nurse may give medicines and take care of the port.   You or a family member can get special training and directions for giving medicine and taking care of the port at home.  SEEK MEDICAL CARE IF:   Your port does not flush or you are unable to get a blood return.   You have a fever or chills. SEEK IMMEDIATE MEDICAL CARE IF:  You have new fluid or pus coming from your incision.   You notice a bad smell coming from your incision site.   You have swelling, pain, or more redness at the incision or port site.   You have chest pain or shortness of breath. Document Released: 09/22/2013 Document Revised: 12/07/2013 Document Reviewed: 09/22/2013 [facility] Patient Information 2015 [place], [place]. This information is not intended to replace  advice given to you by your health care provider. Make sure you discuss any questions you have with your health care provider. Implanted [facility] Guide An implanted port is a type of central line that is placed under the skin. Central lines are used to provide IV access when treatment or nutrition needs to be given through a person's veins. Implanted ports are used for long-term IV access. An implanted port may be placed because:   You need IV medicine that would be irritating to the small veins in your hands or arms.   You need long-term IV medicines, such as antibiotics.   You need IV nutrition for a long period.   You need frequent blood draws for lab tests.   You need dialysis.  Implanted ports are usually placed in the chest area, but they can also be placed in the upper arm, the abdomen, or the leg. An implanted port has two main parts:   Reservoir. The reservoir is round and will appear as a small, raised area under your skin. The reservoir is the part where a needle is inserted to give medicines or draw blood.   Catheter. The catheter is a thin, flexible tube that extends from the reservoir. The catheter is placed into a large vein. Medicine that is inserted into the reservoir goes into the catheter and then into the vein.  HOW WILL I CARE FOR MY INCISION SITE? Do not get the incision site wet today. Bathe  or shower tomorrow night or Saturday morning.  HOW IS MY PORT ACCESSED? Special steps must be taken to access the port:   Before the port is accessed, a numbing cream can be placed on the skin. This helps numb the skin over the port site.   Your health care provider uses a sterile technique to access the port.  Your health care provider must put on a mask and sterile gloves.  The skin over your port is cleaned carefully with an antiseptic and allowed to dry.  The port is gently pinched between sterile gloves, and a needle is inserted into the port.  Only  "non-coring" port needles should be used to access the port. Once the port is accessed, a blood return should be checked. This helps ensure that the port is in the vein and is not clogged.   If your port needs to remain accessed for a constant infusion, a clear (transparent) bandage will be placed over the needle site. The bandage and needle will need to be changed every week, or as directed by your health care provider.   Keep the bandage covering the needle clean and dry. Do not get it wet. Follow your health care provider's instructions on how to take a shower or bath while the port is accessed.   If your port does not need to stay accessed, no bandage is needed over the port.  WHAT IS FLUSHING? Flushing helps keep the port from getting clogged. Follow your health care provider's instructions on how and when to flush the port. Ports are usually flushed with saline solution or a medicine called heparin. The need for flushing will depend on how the port is used.   If the port is used for intermittent medicines or blood draws, the port will need to be flushed:   After medicines have been given.   After blood has been drawn.   As part of routine maintenance.   If a constant infusion is running, the port may not need to be flushed.  HOW LONG WILL MY PORT STAY IMPLANTED? The port can stay in for as long as your health care provider thinks it is needed. When it is time for the port to come out, surgery will be done to remove it. The procedure is similar to the one performed when the port was put in.  WHEN SHOULD I SEEK IMMEDIATE MEDICAL CARE? When you have an implanted port, you should seek immediate medical care if:   You notice a bad smell coming from the incision site.   You have swelling, redness, or drainage at the incision site.   You have more swelling or pain at the port site or the surrounding area.   You have a fever that is not controlled with medicine. Document  Released: 12/02/2005 Document Revised: 09/22/2013 Document Reviewed: 08/09/2013 [facility] Patient Information 2015 [place], [place]. This information is not intended to replace advice given to you by your health care provider. Make sure you discuss any questions you have with your he  Conscious Sedation, Adult, Care After Refer to this sheet in the next few weeks. These instructions provide you with information on caring for yourself after your procedure. Your health care provider may also give you more specific instructions. Your treatment has been planned according to current medical practices, but problems sometimes occur. Call your health care provider if you have any problems or questions after your procedure. WHAT TO EXPECT AFTER THE PROCEDURE  After your procedure:  You may feel sleepy, clumsy, and have poor balance for several hours.  Vomiting may occur if you eat too soon after the procedure. HOME CARE INSTRUCTIONS  Do not participate in any activities where you could become injured for at least 24 hours. Do not:  Drive.  Swim.  Ride a bicycle.  Operate heavy machinery.  Cook.  Use power tools.  Climb ladders.  Work from a high place.  Do not make important decisions or sign legal documents until you are improved.  If you vomit, drink water, juice, or soup when you can drink without vomiting. Make sure you have little or no nausea before eating solid foods.  Only take over-the-counter or prescription medicines for pain, discomfort, or fever as directed by your health care provider.  Make sure you and your family fully understand everything about the medicines given to you, including what side effects may occur.  You should not drink alcohol, take sleeping pills, or take medicines that cause drowsiness for at least 24 hours.  If you  smoke, do not smoke without supervision.  If you are feeling better, you may resume normal activities 24 hours after you were sedated.  Keep all appointments with your health care provider. SEEK MEDICAL CARE IF:  Your skin is pale or bluish in color.  You continue to feel nauseous or vomit.  Your pain is getting worse and is not helped by medicine.  You have bleeding or swelling.  You are still sleepy or feeling clumsy after 24 hours. SEEK IMMEDIATE MEDICAL CARE IF:  You develop a rash.  You have difficulty breathing.  You develop any type of allergic problem.  You have a fever. MAKE SURE YOU:  Understand these instructions.  Will watch your condition.  Will get help right away if you are not doing well or get worse. Document Released: 09/22/2013 Document Reviewed: 09/22/2013 [facility] Patient Information 2015 [place], [place]. This information is not intended to replace advice given to you by your health care provider. Make sure you discuss any questions you have with your health care provider.

## 2015-02-23 NOTE — Procedures (Signed)
Interventional Radiology Procedure Note  Procedure: Placement of a right IJ approach single lumen PowerPort.  Tip is positioned at the superior cavoatrial junction and catheter is ready for immediate use.  Complications: No immediate Recommendations:  - Ok to shower tomorrow - Do not submerge for 7 days - Routine line care   Rebeka Kimble T. Nichalas Coin, M.D Pager:  [phone]   

## 2015-02-23 NOTE — H&P (Signed)
Chief Complaint: "I'm here for a port a cath"  Referring Physician(s): Gorsuch,Ni  History of Present Illness: Timothy Stevenson is a 64 y.o. 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 He presents today for Port-A-Cath placement for chemotherapy.  Past Medical History  Diagnosis Date  . Hypertension   . GERD (gastroesophageal reflux disease)   . 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 Squamous cell cancer of skin of ala nasi 01/18/2015  . 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 OSA (obstructive sleep apnea)   . Blind     Lost eye site a couple weeks ago  . Sinus disorder     Opened and draining- more bloody    Past Surgical History  Procedure Laterality Date  . Partial hip arthroplasty    . Endarterectomy    . Esophageal dilation    . Cataract extraction    . Facial rhytidectomy Bilateral     Multiple facial / eyeball surgeries at [facility] since 2008    Allergies: Aquaphor; Ivp dye; and Triple antibiotic  Medications: Prior to Admission medications   Medication Sig Start Date End Date Taking? Authorizing Provider  acetaminophen (TYLENOL) 500 MG tablet Take 1,000 mg by mouth every 6 (six) hours as needed for mild pain.   Yes Historical Provider, MD  ferrous fumarate (HEMOCYTE - 106 MG FE) 325 (106 FE) MG TABS tablet Take 1 tablet by mouth at bedtime.   Yes Historical Provider, MD  Magnesium Hydroxide (MILK OF MAGNESIA PO) Take 30 mLs by mouth daily as needed (constipation).    Yes Historical Provider, MD  mirtazapine (REMERON) 15 MG tablet Take 7.5 mg by mouth at bedtime.   Yes Historical Provider, MD  naproxen sodium (ANAPROX) 220 MG tablet Take 220 mg by mouth 2 (two) times daily as needed (Pain).    Yes Historical Provider, MD  OLANZapine (ZYPREXA) 5 MG tablet Take 1 tablet (5 mg total) by mouth at bedtime. 02/01/15  Yes Heath Lark, MD  omeprazole (PRILOSEC) 20 MG capsule Take 1 capsule (20 mg total) by mouth daily. 02/01/15  Yes Heath Lark, MD  ondansetron (ZOFRAN) 8 MG tablet Take 1 tablet (8 mg total) by mouth every 8 (eight) hours as needed for nausea or vomiting. 02/01/15  Yes Heath Lark, MD  OxyCODONE (OXYCONTIN) 20 mg T12A 12 hr tablet Take 1 tablet (20 mg total) by mouth every 12 (twelve) hours. 01/31/15  Yes Heath Lark, MD  oxycodone (ROXICODONE) 30 MG immediate release tablet Take 1 tablet (30 mg total) by mouth every 4 (four) hours as needed for pain. 02/01/15  Yes Heath Lark, MD  sennosides-docusate sodium (SENOKOT-S) 8.6-50 MG tablet Take 1 tablet by mouth daily.   Yes Historical Provider, MD  diphenhydramine-acetaminophen (TYLENOL PM) 25-500 MG TABS Take 2 tablets by mouth at bedtime as needed (pain/sleep).    Historical Provider, MD  lidocaine-prilocaine (EMLA) cream Apply 1 application topically as needed. 01/30/15   Heath Lark, MD    Family History  Problem Relation Age of Onset  . Cancer Mother     lymphoma  . Cancer Father     stomach ca  . Cancer Maternal Aunt     ca  . Cancer Maternal Uncle     ca  . Cancer Paternal Aunt     ca  . Cancer Paternal Uncle  ca    History   Social History  . Marital Status: Married    Spouse Name: N/A  . Number of Children: N/A  . Years of Education: N/A   Social History Main Topics  . Smoking status: Former Smoker -- 1.50 packs/day for 20 years    Quit date: 12/16/2004  . Smokeless tobacco: Never Used  . Alcohol Use: No  . Drug Use: No  . Sexual Activity: Not on file   Other Topics Concern  . None   Social History Narrative         Review of Systems  Constitutional: Negative for fever and chills.  HENT: Positive for congestion, hearing loss and sinus pressure.   Eyes:       Enucleated left eye, blind rt eye  Respiratory: Positive for cough. Negative for shortness of breath.   Cardiovascular: Negative for chest pain.    Gastrointestinal: Negative for vomiting, abdominal pain and blood in stool.       Occ nausea  Genitourinary: Negative for dysuria and hematuria.  Musculoskeletal: Negative for back pain.  Neurological: Positive for headaches.  Hematological: Does not bruise/bleed easily.  Psychiatric/Behavioral: The patient is nervous/anxious.     Vital Signs: BP 124/54 mmHg  Pulse 78  Temp(Src) 98.4 F (36.9 C) (Oral)  Resp 18  SpO2 98%  Physical Exam  Constitutional: He is oriented to person, place, and time.  Thin WM in NAD  Eyes:  blind right eye with small amt drainage, patch over enucleated left eye  Cardiovascular: Normal rate and regular rhythm.   Pulmonary/Chest: Effort normal.  Few left basilar crackles, right side clear  Abdominal: Soft. Bowel sounds are normal. There is no tenderness.  Musculoskeletal: Normal range of motion. He exhibits no edema.  Neurological: He is alert and oriented to person, place, and time.    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 Headache and dizziness.  EXAM: MRI HEAD WITHOUT CONTRAST  TECHNIQUE: Multiplanar, multiecho pulse sequences of the brain and surrounding structures were obtained without intravenous contrast.  COMPARISON:  PET-CT 01/31/2015  FINDINGS: Extensive facial resection including the nose, left globe, ethmoid sinuses. Air-fluid in the left maxillary sinus. Extensive mucosal thickening in the right maxillary sinus. Bilateral mastoid sinus effusion.  Intravenous contrast not given due to contrast allergy (anaphylaxis). This significantly limits ability to detect recurrent tumor.  Lobular soft tissue is present in the left orbit and left side of the surgical resection site, likely related to tumor. There is mild asymmetry of the left cavernous sinus which could be due to tumor extension. There is abnormal signal in the sphenoid sinus and clivus which could be due to  tumor or chronic sinusitis.  There is a small amount of edema in the left inferior frontal lobe just above the cribriform plate, worrisome for intracranial tumor extension. This would be best evaluated with contrast however I suspect there is approximate 1 cm mass in the left inferior frontal lobe compatible with tumor. There appears to be some dural thickening in the area.  Ventricle size is normal. Mild atrophy is present. Negative for acute infarct. Scattered small white matter hyperintensities consistent with microvascular ischemia. Negative for intracranial hemorrhage.  IMPRESSION: Extensive surgical resection of the nose, ethmoid sinuses and left globe for cancer surgery.  Intravenous contrast not administered due to allergy.  Extensive lobular mass in the left orbit and to the left of the resection  cavity, highly suspicious for local tumor. There is concern of tumor in the left cavernous sinus. There is high likelihood of intracranial extension of tumor into the left inferior frontal lobe with white matter edema in the adjacent frontal lobe. There is possible tumor in the clivus although this could be due to sphenoid sinusitis.   Electronically Signed   By: Franchot Gallo M.D.   On: 02/01/2015 09:32   Nm Pet Image Restag (ps) Skull Base To Thigh  01/31/2015   CLINICAL DATA:  Subsequent treatment strategy for squamous cell cancer of the head neck.  EXAM: NUCLEAR MEDICINE PET SKULL BASE TO THIGH  TECHNIQUE: 5.93 mCi F-18 FDG was injected intravenously. Full-ring PET imaging was performed from the skull base to thigh after the radiotracer. CT data was obtained and used for attenuation correction and anatomic localization.  FASTING BLOOD GLUCOSE:  Value: 79 mg/dl  COMPARISON:  None.  FINDINGS: NECK  Extensive surgical changes involving the face. The nose and ethmoid sinuses have been resected along with the medial walls of the maxillary sinuses and anterior walls of the sphenoid sinus. The medial orbital wall  has been resected and the left globe has been resected. There is a fatty graft covering the left maxillary sinus and orbit. There is extensive tumor in the surgical bed greater on the left side involving the left orbit, left maxillary sinus, right maxillary sinus and medial aspect of the right orbit. SUV max is 8.0. There is also a small hypermetabolic subcutaneous nodule along the left maxilla which is metabolically active with SUV max of 4.2.  No neck adenopathy.  CHEST  Emphysematous changes are noted along with scarring changes. No metabolically active mediastinal or hilar lymph nodes. Vague ground-glass/semi-solid nodule in the right upper lobe is weakly hypermetabolic with SUV max of 2.45. Could not exclude adenocarcinoma.  4 mm peripheral right lower lobe pulmonary nodule on CT image number 96 is not metabolically active. There is also a 2 mm pulmonary nodule in the right upper lobe on image number 90. These are indeterminate but require followup.  ABDOMEN/PELVIS  No abnormal hypermetabolic activity within the liver, pancreas, adrenal glands, or spleen. No hypermetabolic lymph nodes in the abdomen or pelvis.  Multiple renal cysts are noted. There are moderate to advanced atherosclerotic calcifications involving the aorta and iliac arteries.  The lower pelvis is somewhat obscured by bilateral hip prosthesis. Large amount of stool throughout the colon suggesting constipation.  SKELETON  Diffuse osseous uptake is likely due to rebound from chemotherapy or marrow stimulating drugs.  IMPRESSION: 1. Extensive facial neoplasm involving the orbits and sinuses. Extensive surgical changes are noted. 2. Small hypermetabolic left facial node near the maxilla. 3. No enlarged or hypermetabolic cervical adenopathy. 4. 14.5 mm semisolid nodule in the right upper lobe is weakly hypermetabolic. It could represent a focus of infection but a low-grade adenocarcinoma is also possible. There are also 2 smaller right lung nodules.  Recommend close CT follow-up. 5. No findings for abdominal/pelvic metastatic disease. 6. Diffuse osseous uptake is likely due to rebound from chemotherapy or marrow stimulating drugs.   Electronically Signed   By: Marijo Sanes M.D.   On: 01/31/2015 13:03    Labs:  CBC:  Recent Labs  01/18/15 1539 01/31/15 0800 02/01/15 1129 02/03/15 1124  WBC 20.3* 22.7* 26.8* 18.6*  HGB 8.5* 8.4* 7.9* 10.0*  HCT 27.8* 27.5* 25.8* 31.8*  PLT 547* 618* 527* 554*    COAGS:  Recent Labs  01/31/15 0800  INR  1.09  APTT 34    BMP:  Recent Labs  10/28/14 2302 10/29/14 0610 01/18/15 1539 02/01/15 1129 02/03/15 1126  NA 130* 132* 136 136 136  K 5.5* 4.0 4.0 4.2 4.8  CL 88* 95*  --   --   --   CO2 23  --  24 25 24   GLUCOSE 98 127* 112 88 104  BUN 13 9 19.7 35.3* 22.0  CALCIUM 9.8  --  9.1 8.9 8.8  CREATININE 0.98 0.90 1.2 2.1* 1.1  GFRNONAA 85*  --   --   --   --   GFRAA >90  --   --   --   --     LIVER FUNCTION TESTS:  Recent Labs  10/28/14 2302 01/18/15 1539 02/01/15 1129 02/03/15 1126  BILITOT 0.4 0.29 <0.20 0.33  AST 12 11 10 10   ALT 10 8 7 6   ALKPHOS 124* 111 104 110  PROT 8.2 7.7 7.3 6.6  ALBUMIN 3.5 3.0* 2.9* 2.7*    TUMOR MARKERS: No results for input(s): AFPTM, CEA, CA199, CHROMGRNA in the last 8760 hours.  Assessment and Plan: Yuvan Medinger is a 64 y.o. 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 He presents today for Port-A-Cath placement for chemotherapy. Risks and benefits discussed with the patient/spouse including, but not limited to bleeding, infection, pneumothorax, or fibrin sheath development and need for additional procedures. All of the patient's questions were answered, patient is agreeable to proceed. Consent signed and in chart.      Signed: Autumn Messing 02/23/2015, 8:53 AM   I spent a total of 20 minutes face to face in clinical  consultation, greater than 50% of which was counseling/coordinating care for Port-A-Cath placement

## 2015-02-24 ENCOUNTER — Telehealth: Payer: Self-pay | Admitting: *Deleted

## 2015-02-24 ENCOUNTER — Encounter: Payer: Self-pay | Admitting: *Deleted

## 2015-02-24 ENCOUNTER — Ambulatory Visit (HOSPITAL_BASED_OUTPATIENT_CLINIC_OR_DEPARTMENT_OTHER): Payer: Medicare Other

## 2015-02-24 ENCOUNTER — Ambulatory Visit: Payer: Medicare Other | Admitting: Nutrition

## 2015-02-24 ENCOUNTER — Ambulatory Visit (HOSPITAL_BASED_OUTPATIENT_CLINIC_OR_DEPARTMENT_OTHER): Payer: Medicare Other | Admitting: Hematology and Oncology

## 2015-02-24 ENCOUNTER — Telehealth: Payer: Self-pay | Admitting: Hematology and Oncology

## 2015-02-24 ENCOUNTER — Encounter: Payer: Self-pay | Admitting: Hematology and Oncology

## 2015-02-24 VITALS — BP 138/52 | HR 77 | Temp 98.2°F | Resp 17 | Ht 69.0 in | Wt 112.3 lb

## 2015-02-24 DIAGNOSIS — G893 Neoplasm related pain (acute) (chronic): Secondary | ICD-10-CM | POA: Diagnosis not present

## 2015-02-24 DIAGNOSIS — C44321 Squamous cell carcinoma of skin of nose: Secondary | ICD-10-CM | POA: Diagnosis not present

## 2015-02-24 DIAGNOSIS — D72829 Elevated white blood cell count, unspecified: Secondary | ICD-10-CM

## 2015-02-24 DIAGNOSIS — Z5112 Encounter for antineoplastic immunotherapy: Secondary | ICD-10-CM | POA: Diagnosis not present

## 2015-02-24 DIAGNOSIS — H540 Blindness, both eyes: Secondary | ICD-10-CM

## 2015-02-24 DIAGNOSIS — D630 Anemia in neoplastic disease: Secondary | ICD-10-CM | POA: Diagnosis not present

## 2015-02-24 DIAGNOSIS — H543 Unqualified visual loss, both eyes: Secondary | ICD-10-CM

## 2015-02-24 DIAGNOSIS — E860 Dehydration: Secondary | ICD-10-CM

## 2015-02-24 DIAGNOSIS — N19 Unspecified kidney failure: Secondary | ICD-10-CM

## 2015-02-24 MED ORDER — MORPHINE SULFATE 30 MG PO TABS: 30.0000 mg | ORAL_TABLET | ORAL | Status: AC | PRN

## 2015-02-24 MED ORDER — PROCHLORPERAZINE MALEATE 10 MG PO TABS
10.0000 mg | ORAL_TABLET | Freq: Once | ORAL | Status: AC
  Administered 2015-02-24: 10 mg via ORAL

## 2015-02-24 MED ORDER — SODIUM CHLORIDE 0.9 % IV SOLN
Freq: Once | INTRAVENOUS | Status: AC
  Administered 2015-02-24: 10:00:00 via INTRAVENOUS

## 2015-02-24 MED ORDER — SODIUM CHLORIDE 0.9 % IV SOLN
2.0000 mg/kg | Freq: Once | INTRAVENOUS | Status: AC
  Administered 2015-02-24: 100 mg via INTRAVENOUS
  Filled 2015-02-24: qty 4

## 2015-02-24 MED ORDER — SODIUM CHLORIDE 0.9 % IJ SOLN
10.0000 mL | INTRAMUSCULAR | Status: DC | PRN
  Administered 2015-02-24: 10 mL
  Filled 2015-02-24: qty 10

## 2015-02-24 MED ORDER — SODIUM CHLORIDE 0.9 % IV SOLN
INTRAVENOUS | Status: AC
  Administered 2015-02-24: 11:00:00 via INTRAVENOUS

## 2015-02-24 MED ORDER — MIRTAZAPINE 15 MG PO TABS: 15.0000 mg | ORAL_TABLET | Freq: Every day | ORAL | Status: AC

## 2015-02-24 MED ORDER — PROCHLORPERAZINE MALEATE 10 MG PO TABS
ORAL_TABLET | ORAL | Status: AC
  Filled 2015-02-24: qty 1

## 2015-02-24 MED ORDER — MORPHINE SULFATE ER 30 MG PO TBCR: 30.0000 mg | EXTENDED_RELEASE_TABLET | Freq: Three times a day (TID) | ORAL | Status: AC

## 2015-02-24 MED ORDER — HEPARIN SOD (PORK) LOCK FLUSH 100 UNIT/ML IV SOLN
500.0000 [IU] | Freq: Once | INTRAVENOUS | Status: AC | PRN
  Administered 2015-02-24: 500 [IU]
  Filled 2015-02-24: qty 5

## 2015-02-24 MED ORDER — OMEPRAZOLE 20 MG PO CPDR: 20.0000 mg | DELAYED_RELEASE_CAPSULE | Freq: Every day | ORAL | Status: AC

## 2015-02-24 NOTE — Progress Notes (Signed)
1. To provide support and encouragement, care continuity and to assess for needs, met with patient during follow-up appt with Dr. Alvy Bimler.  His wife accompanied him.  Pt amenable to Dr. Calton Dach recommendation for IVF tomorrow at [facility] and on regular basis upon his return to [place] this weekend.  With regard to IVF when he returns home, I will contact his PCP Dr. William Dalton. 2. Later met with patient and his wife in Infusion to provide support.  Gayleen Orem, RN, BSN, [place] [facility] [place] [phone]

## 2015-02-24 NOTE — Assessment & Plan Note (Signed)
I refilled his prescription for pain medicine. We discussed about narcotic refill policy.

## 2015-02-24 NOTE — Telephone Encounter (Signed)
gv and printed appt sched and avs forpt for April....sed added tx. °

## 2015-02-24 NOTE — Patient Instructions (Signed)
Cancer Center Discharge Instructions for Patients Receiving Chemotherapy  Today you received the following chemotherapy agents: Keytruda   To help prevent nausea and vomiting after your treatment, we encourage you to take your nausea medication as directed  If you develop nausea and vomiting that is not controlled by your nausea medication, call the clinic.   BELOW ARE SYMPTOMS THAT SHOULD BE REPORTED IMMEDIATELY:  *FEVER GREATER THAN 100.5 F  *CHILLS WITH OR WITHOUT FEVER  NAUSEA AND VOMITING THAT IS NOT CONTROLLED WITH YOUR NAUSEA MEDICATION  *UNUSUAL SHORTNESS OF BREATH  *UNUSUAL BRUISING OR BLEEDING  TENDERNESS IN MOUTH AND THROAT WITH OR WITHOUT PRESENCE OF ULCERS  *URINARY PROBLEMS  *BOWEL PROBLEMS  UNUSUAL RASH Items with * indicate a potential emergency and should be followed up as soon as possible.  Feel free to call the clinic you have any questions or concerns. The clinic phone number is [phone].  

## 2015-02-24 NOTE — Telephone Encounter (Signed)
Informed patient's wife of my contact with Dr. Haynes Hoehn office.  She expressed appreciation for update.  Gayleen Orem, RN, BSN, [place] [facility] [place] [phone]

## 2015-02-24 NOTE — Progress Notes (Signed)
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 Pathology showed squamous cell carcinoma   01/19/2009 Surgery He had repeat Mohs surgery to the face   05/19/2012 Surgery Left Caldwell-luc approach left infraorbital nerve with selective left neck LN dissection   05/06/2013 Surgery he has repeat nasal endoscopy with extensive sinus surgery, nose removal and reconstruction surgery   05/14/2013 Surgery he had exenteration of left orbital content, parotid dissection, facial nerve removal, radical neck dissection and free flap reconstruction   06/01/2013 Surgery he had reconstruction surgery and repair of CSF leak   07/06/2013 Surgery He had cranial procedure to repeat CSF leak   07/19/2013 - 08/30/2013 Radiation Therapy he had concurrent chemo/RT   07/19/2013 - 08/30/2013 Chemotherapy he had concurrent chemo/RT with cisplatin   09/29/2013 Imaging PET/CT scan showed residual disease in sunus bone and hypermetabolic LN in the right neck   12/08/2013 Surgery He had repeat nasal endoscopy and debridement   12/14/2013 Pathology Results Repeat sinus biopsy at [facility] confirmed recurrence of squamous cell carcinoma   03/21/2014 - 09/19/2014 Chemotherapy He received erlotinib at 75 mg/m2   05/03/2014 Imaging Ct sinus showed progression of disease   09/20/2014 Imaging CT sinus & MRI brain showed soft tissue thickening in sinuses worrisome of disease recurrence   11/14/2014 - 11/14/2014 Chemotherapy He received only 1 dose of docetaxel   01/31/2015 Imaging PET CT scan show extensive cranial involvement   02/01/2015 Imaging MRI of the brain show possible extension to the  frontal lobe   02/02/2015 -  Chemotherapy Patient is started on palliative Pembrolizumab via of compassionate care program.   02/23/2015 Procedure He has placement of Port    INTERVAL HISTORY: Please see below for problem oriented charting. He is seen today prior to cycle 2 of treatment. 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 He denies recent fall. His wife noted there is less bleeding from the wound. There is foul-smelling necrotic discharge from his face. He has difficulties eating and may have reduced oral intake recently. Denies recent nausea.  REVIEW OF SYSTEMS:   Constitutional: Denies fevers, chills or abnormal weight loss Ears, nose, mouth, throat, and face: Denies mucositis or sore throat Respiratory: Denies cough, dyspnea or wheezes Cardiovascular: Denies palpitation, chest discomfort or lower extremity swelling Gastrointestinal:  Denies nausea, heartburn or change in bowel habits Skin: Denies abnormal skin rashes Lymphatics: Denies new lymphadenopathy or easy bruising Neurological:Denies numbness, tingling or new weaknesses Behavioral/Psych: Mood is stable, no new changes  All other systems were reviewed with the patient and are negative.  I have reviewed the past medical history, past surgical history, social history and family history with the patient and they are unchanged from previous note.  ALLERGIES:  is allergic to aquaphor; ivp dye; and triple antibiotic.  MEDICATIONS:  Current Outpatient Prescriptions  Medication Sig Dispense Refill  . acetaminophen (TYLENOL) 500 MG tablet Take 1,000 mg by mouth every 6 (six) hours as needed for mild pain.    . diphenhydramine-acetaminophen (TYLENOL PM) 25-500 MG TABS Take 2 tablets by mouth at bedtime as needed (  pain/sleep).    . ferrous fumarate (HEMOCYTE - 106 MG FE) 325 (106 FE) MG TABS tablet Take 1 tablet by mouth at bedtime.    .  lidocaine-prilocaine (EMLA) cream Apply 1 application topically as needed. 30 g 1  . Magnesium Hydroxide (MILK OF MAGNESIA PO) Take 30 mLs by mouth daily as needed (constipation).     . mirtazapine (REMERON) 15 MG tablet Take 1 tablet (15 mg total) by mouth at bedtime. 30 tablet 6  . morphine (MS CONTIN) 30 MG 12 hr tablet Take 1 tablet (30 mg total) by mouth 3 (three) times daily. 63 tablet 0  . morphine (MSIR) 30 MG tablet Take 1 tablet (30 mg total) by mouth every 4 (four) hours as needed for severe pain. 90 tablet 0  . naproxen sodium (ANAPROX) 220 MG tablet Take 220 mg by mouth 2 (two) times daily as needed (Pain).     Marland Kitchen OLANZapine (ZYPREXA) 5 MG tablet Take 1 tablet (5 mg total) by mouth at bedtime. 60 tablet 3  . omeprazole (PRILOSEC) 20 MG capsule Take 1 capsule (20 mg total) by mouth daily. 90 capsule 3  . ondansetron (ZOFRAN) 8 MG tablet Take 1 tablet (8 mg total) by mouth every 8 (eight) hours as needed for nausea or vomiting. 60 tablet 3  . OxyCODONE (OXYCONTIN) 20 mg T12A 12 hr tablet Take 1 tablet (20 mg total) by mouth every 12 (twelve) hours. 60 tablet 0  . oxycodone (ROXICODONE) 30 MG immediate release tablet Take 1 tablet (30 mg total) by mouth every 4 (four) hours as needed for pain. 90 tablet 0  . sennosides-docusate sodium (SENOKOT-S) 8.6-50 MG tablet Take 1 tablet by mouth daily.     No current facility-administered medications for this visit.   Facility-Administered Medications Ordered in Other Visits  Medication Dose Route Frequency Provider Last Rate Last Dose  . sodium chloride 0.9 % injection 10 mL  10 mL Intracatheter PRN Heath Lark, MD   10 mL at 02/24/15 1316    PHYSICAL EXAMINATION: ECOG PERFORMANCE STATUS: 2 - Symptomatic, <50% confined to bed  Filed Vitals:   02/24/15 0911  BP: 138/52  Pulse: 77  Temp: 98.2 F (36.8 C)  Resp: 17   Filed Weights   02/24/15 0911  Weight: 112 lb 4.8 oz (50.939 kg)    GENERAL:alert, no distress and comfortable SKIN:   There is necrotic wound on his face  With foul-smelling discharge EYES:   The right orbit is invaded by cancer OROPHARYNX: the patient has significant reconstruction surgery. No mucositis. NECK: supple, thyroid normal size, non-tender, without nodularity LYMPH:  no palpable lymphadenopathy in the cervical, axillary or inguinal LUNGS: clear to auscultation and percussion with normal breathing effort HEART: regular rate & rhythm and no murmurs and no lower extremity edema ABDOMEN:abdomen soft, non-tender and normal bowel sounds Musculoskeletal:no cyanosis of digits and no clubbing  NEURO: alert & oriented x 3 with fluent speech, no focal motor/sensory deficits  LABORATORY DATA:  I have reviewed the data as listed    Component Value Date/Time   NA 135 02/23/2015 0830   NA 136 02/03/2015 1126   K 5.1 02/23/2015 0830   K 4.8 02/03/2015 1126   CL 99 02/23/2015 0830   CO2 27 02/23/2015 0830   CO2 24 02/03/2015 1126   GLUCOSE 85 02/23/2015 0830   GLUCOSE 104 02/03/2015 1126   BUN 47* 02/23/2015 0830   BUN 22.0 02/03/2015 1126   CREATININE 2.15* 02/23/2015 0830   CREATININE  1.1 02/03/2015 1126   CALCIUM 8.4 02/23/2015 0830   CALCIUM 8.8 02/03/2015 1126   PROT 7.5 02/23/2015 0830   PROT 6.6 02/03/2015 1126   ALBUMIN 2.5* 02/23/2015 0830   ALBUMIN 2.7* 02/03/2015 1126   AST 19 02/23/2015 0830   AST 10 02/03/2015 1126   ALT 14 02/23/2015 0830   ALT 6 02/03/2015 1126   ALKPHOS 137* 02/23/2015 0830   ALKPHOS 110 02/03/2015 1126   BILITOT 0.5 02/23/2015 0830   BILITOT 0.33 02/03/2015 1126   GFRNONAA 31* 02/23/2015 0830   GFRAA 35* 02/23/2015 0830    No results found for: SPEP, UPEP  Lab Results  Component Value Date   WBC 30.7* 02/23/2015   NEUTROABS 25.9* 02/23/2015   HGB 8.4* 02/23/2015   HCT 27.9* 02/23/2015   MCV 85.3 02/23/2015   PLT 740* 02/23/2015      Chemistry      Component Value Date/Time   NA 135 02/23/2015 0830   NA 136 02/03/2015 1126   K 5.1 02/23/2015  0830   K 4.8 02/03/2015 1126   CL 99 02/23/2015 0830   CO2 27 02/23/2015 0830   CO2 24 02/03/2015 1126   BUN 47* 02/23/2015 0830   BUN 22.0 02/03/2015 1126   CREATININE 2.15* 02/23/2015 0830   CREATININE 1.1 02/03/2015 1126      Component Value Date/Time   CALCIUM 8.4 02/23/2015 0830   CALCIUM 8.8 02/03/2015 1126   ALKPHOS 137* 02/23/2015 0830   ALKPHOS 110 02/03/2015 1126   AST 19 02/23/2015 0830   AST 10 02/03/2015 1126   ALT 14 02/23/2015 0830   ALT 6 02/03/2015 1126   BILITOT 0.5 02/23/2015 0830   BILITOT 0.33 02/03/2015 1126       RADIOGRAPHIC STUDIES: I have personally reviewed the radiological images as listed and agreed with the findings in the report. Ir Fluoro Guide Cv Line Right  02/23/2015   CLINICAL DATA:  Squamous carcinoma of the face with extensive facial destruction and visual loss. The patient requires a porta cath for additional chemotherapy treatment.  EXAM: IMPLANTED PORT A CATH PLACEMENT WITH ULTRASOUND AND FLUOROSCOPIC GUIDANCE  ANESTHESIA/SEDATION: 0.5 Mg IV Versed; 25 mcg IV Fentanyl  Total Moderate Sedation Time:  41 minutes.  Additional Medications: 2 g IV Ancef. As antibiotic prophylaxis, Ancef was ordered pre-procedure and administered intravenously within one hour of incision.  FLUOROSCOPY TIME:  minutes  PROCEDURE: The procedure, risks, benefits, and alternatives were explained to the patient. Questions regarding the procedure were encouraged and answered. The patient understands and consents to the procedure.  The right neck and chest were prepped with chlorhexidine in a sterile fashion, and a sterile drape was applied covering the operative field. Maximum barrier sterile technique with sterile gowns and gloves were used for the procedure. Local anesthesia was provided with 1% lidocaine. A time-out was performed prior to the procedure.  Ultrasound was used to confirm patency of the right internal jugular vein. After creating a small venotomy incision, a 21  gauge needle was advanced into the right internal jugular vein under direct, real-time ultrasound guidance. Ultrasound image documentation was performed. After securing guidewire access, an 8 Fr dilator was placed. A J-wire was kinked to measure appropriate catheter length.  A subcutaneous port pocket was then created along the upper chest wall utilizing sharp and blunt dissection. Portable cautery was utilized. The pocket was irrigated with sterile saline.  A single lumen power injectable port was chosen for placement. The 8 Fr catheter was tunneled  from the port pocket site to the venotomy incision. The port was placed in the pocket. External catheter was trimmed to appropriate length based on guidewire measurement.  At the venotomy, an 8 Fr peel-away sheath was placed over a guidewire. The catheter was then placed through the sheath and the sheath removed. Final catheter positioning was confirmed and documented with a fluoroscopic spot image. The port was accessed with a needle and aspirated and flushed with heparinized saline. The needle was removed.  The venotomy and port pocket incisions were closed with subcutaneous 3-0 Monocryl and subcuticular 4-0 Vicryl. Dermabond was applied to both incisions.  COMPLICATIONS: None  FINDINGS: After catheter placement, the tip lies at the cavoatrial junction. The catheter aspirates normally and is ready for immediate use.  IMPRESSION: Placement of single lumen port a cath via right internal jugular vein. The catheter tip lies at the cavoatrial junction. A power injectable port a cath was placed and is ready for immediate use.   Electronically Signed   By: Aletta Edouard M.D.   On: 02/23/2015 17:10   Ir US Guide Vasc Access Right  02/23/2015   CLINICAL DATA:  Squamous carcinoma of the face with extensive facial destruction and visual loss. The patient requires a porta cath for additional chemotherapy treatment.  EXAM: IMPLANTED PORT A CATH PLACEMENT WITH ULTRASOUND AND  FLUOROSCOPIC GUIDANCE  ANESTHESIA/SEDATION: 0.5 Mg IV Versed; 25 mcg IV Fentanyl  Total Moderate Sedation Time:  41 minutes.  Additional Medications: 2 g IV Ancef. As antibiotic prophylaxis, Ancef was ordered pre-procedure and administered intravenously within one hour of incision.  FLUOROSCOPY TIME:  minutes  PROCEDURE: The procedure, risks, benefits, and alternatives were explained to the patient. Questions regarding the procedure were encouraged and answered. The patient understands and consents to the procedure.  The right neck and chest were prepped with chlorhexidine in a sterile fashion, and a sterile drape was applied covering the operative field. Maximum barrier sterile technique with sterile gowns and gloves were used for the procedure. Local anesthesia was provided with 1% lidocaine. A time-out was performed prior to the procedure.  Ultrasound was used to confirm patency of the right internal jugular vein. After creating a small venotomy incision, a 21 gauge needle was advanced into the right internal jugular vein under direct, real-time ultrasound guidance. Ultrasound image documentation was performed. After securing guidewire access, an 8 Fr dilator was placed. A J-wire was kinked to measure appropriate catheter length.  A subcutaneous port pocket was then created along the upper chest wall utilizing sharp and blunt dissection. Portable cautery was utilized. The pocket was irrigated with sterile saline.  A single lumen power injectable port was chosen for placement. The 8 Fr catheter was tunneled from the port pocket site to the venotomy incision. The port was placed in the pocket. External catheter was trimmed to appropriate length based on guidewire measurement.  At the venotomy, an 8 Fr peel-away sheath was placed over a guidewire. The catheter was then placed through the sheath and the sheath removed. Final catheter positioning was confirmed and documented with a fluoroscopic spot image. The port  was accessed with a needle and aspirated and flushed with heparinized saline. The needle was removed.  The venotomy and port pocket incisions were closed with subcutaneous 3-0 Monocryl and subcuticular 4-0 Vicryl. Dermabond was applied to both incisions.  COMPLICATIONS: None  FINDINGS: After catheter placement, the tip lies at the cavoatrial junction. The catheter aspirates normally and is ready for immediate use.  IMPRESSION:  Placement of single lumen port a cath via right internal jugular vein. The catheter tip lies at the cavoatrial junction. A power injectable port a cath was placed and is ready for immediate use.   Electronically Signed   By: Aletta Edouard M.D.   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 The intent is for strictly palliative with no concrete data it will work in his situation although some sporadic, rare cases suggested some benefit. The patient is receiving drug treatment free of charge via the drug company through compassionate care program. Unfortunately, before the treatment can take effect, the cancer has invaded into the right orbit and the patient is now completely blind. We discussed the risk and benefit of continuing therapy. He is interested to continue for now. I will proceed with cycle 2 treatment today without adjustment.  I recommend minimum of 3 cycles of treatment before restaging PET CT scan and MRI and he agreed with the plan of care. The patient is having financial difficulties and is losing his house. I recommend the patient and family to move to [facility] so that we can take good care of him here.   Anemia in neoplastic disease This is likely anemia of chronic disease. The patient denies recent history of bleeding such as epistaxis, hematuria or hematochezia. He is asymptomatic from the anemia. We will observe for now.  He does not require transfusion now.   he will benefit  from blood work monitoring at PCP office next week and transfuse as needed daily hemoglobin above 8 g. 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  he is interested to continue treatment.  he is aware, that even if repeat scan show improved disease control, his right eyesight may never be restored.   Cancer associated pain I refilled his prescription for pain medicine. We discussed about narcotic refill policy.     Dehydration This is related to diarrhea and reduced oral fluid intake. I recommend IV fluids daily.   Prerenal renal failure This is related to dehydration and the use of recent naproxen. I recommend IV fluids daily and recheck his kidney function at the end of the week.  I recommend the Navigator to coordinate with home health care agency to provide IV fluids at home as well.   Leukocytosis He has significant tumor necrosis clinically.  as long as the necrotic wound continues to discharge, there is no benefit to continue oral antibiotics.  unless the patient spiked fever, I recommend local wound care only.    No orders of the defined types were placed in this encounter.   All questions were answered. The patient knows to call the clinic with any problems, questions or concerns. No barriers to learning was detected. I spent 40 minutes counseling the patient face to face. The total time spent in the appointment was 55 minutes and more than 50% was on counseling and review of test results     [facility], [place], MD 02/24/2015 2:21 PM

## 2015-02-24 NOTE — Progress Notes (Signed)
Nutrition follow-up completed with patient and wife, during infusion.  Patient continues to have decreased oral intake resulting in weight loss. Weight documented as 112.3 pounds down from 117.9 pounds.  Severe malnutrition continues.  Patient consuming 2 boost plus shakes daily.   Nutrition diagnosis: Unintended weight loss continues   Intervention: provided strategies for increasing calories and protein for improved quality-of-life.  wife appreciative of information.  Teach back method used.   Monitoring, evaluation, goals:  Patient will tolerate increased calories protein, and fluids for improved quality-of-life.   Next visit: I am available to assist patient and wife as needed.  **Disclaimer: This note was dictated with voice recognition software. Similar sounding words can inadvertently be transcribed and this note may contain transcription errors which may not have been corrected upon publication of note.**

## 2015-02-24 NOTE — Assessment & Plan Note (Signed)
We discussed the role of chemotherapy. The intent is for strictly palliative with no concrete data it will work in his situation although some sporadic, rare cases suggested some benefit. The patient is receiving drug treatment free of charge via the drug company through compassionate care program. Unfortunately, before the treatment can take effect, the cancer has invaded into the right orbit and the patient is now completely blind. We discussed the risk and benefit of continuing therapy. He is interested to continue for now. I will proceed with cycle 2 treatment today without adjustment.  I recommend minimum of 3 cycles of treatment before restaging PET CT scan and MRI and he agreed with the plan of care. The patient is having financial difficulties and is losing his house. I recommend the patient and family to move to [facility] so that we can take good care of him here.

## 2015-02-24 NOTE — Assessment & Plan Note (Signed)
He has significant tumor necrosis clinically.  as long as the necrotic wound continues to discharge, there is no benefit to continue oral antibiotics.  unless the patient spiked fever, I recommend local wound care only.

## 2015-02-24 NOTE — Assessment & Plan Note (Signed)
This is related to dehydration and the use of recent naproxen. I recommend IV fluids daily and recheck his kidney function at the end of the week.  I recommend the Navigator to coordinate with home health care agency to provide IV fluids at home as well.

## 2015-02-24 NOTE — Assessment & Plan Note (Signed)
This is related to diarrhea and reduced oral fluid intake. I recommend IV fluids daily.

## 2015-02-24 NOTE — Telephone Encounter (Signed)
1. Spoke with Dr. Haynes Hoehn RN, Violeta Gelinas, informed her of Dr. Calton Dach recommendation for patient to receive IVF on regular basis while home in [place].  She stated Dr. William Dalton can arrange IVF, faxing of Dr. Calton Dach Progress Note from today's patient visit with recommendation would be sufficient to initiate. 2. I faxed Progress Note at 1632, received confirmation of successful transmission.  Fax cover included contact phone # [phone] for Dr. Alvy Bimler.  Gayleen Orem, RN, BSN, [place] at [place] [phone]

## 2015-02-24 NOTE — Assessment & Plan Note (Signed)
This is likely anemia of chronic disease. The patient denies recent history of bleeding such as epistaxis, hematuria or hematochezia. He is asymptomatic from the anemia. We will observe for now.  He does not require transfusion now.   he will benefit from blood work monitoring at PCP office next week and transfuse as needed daily hemoglobin above 8 g. I will get the Navigator to coordinate this with his primary care physician

## 2015-02-24 NOTE — Progress Notes (Signed)
OK TO proceed with chemo today in additional with IVF per Dr. Alvy Bimler, per Moshe Salisbury RN. Ria Comment RN notified. HL

## 2015-02-24 NOTE — Assessment & Plan Note (Signed)
The patient is now completely blind.  he is interested to continue treatment.  he is aware, that even if repeat scan show improved disease control, his right eyesight may never be restored.

## 2015-02-25 ENCOUNTER — Ambulatory Visit (HOSPITAL_BASED_OUTPATIENT_CLINIC_OR_DEPARTMENT_OTHER): Payer: Medicare Other

## 2015-02-25 DIAGNOSIS — C44321 Squamous cell carcinoma of skin of nose: Secondary | ICD-10-CM

## 2015-02-25 DIAGNOSIS — E860 Dehydration: Secondary | ICD-10-CM | POA: Diagnosis not present

## 2015-02-25 MED ORDER — HEPARIN SOD (PORK) LOCK FLUSH 100 UNIT/ML IV SOLN
500.0000 [IU] | Freq: Once | INTRAVENOUS | Status: AC | PRN
  Administered 2015-02-25: 500 [IU]
  Filled 2015-02-25: qty 5

## 2015-02-25 MED ORDER — SODIUM CHLORIDE 0.9 % IJ SOLN
10.0000 mL | INTRAMUSCULAR | Status: DC | PRN
  Administered 2015-02-25: 10 mL
  Filled 2015-02-25: qty 10

## 2015-02-25 MED ORDER — SODIUM CHLORIDE 0.9 % IV SOLN
Freq: Once | INTRAVENOUS | Status: AC
  Administered 2015-02-25: 09:00:00 via INTRAVENOUS

## 2015-02-25 NOTE — Patient Instructions (Signed)

## 2015-02-27 NOTE — Telephone Encounter (Signed)
Thanks Rick.

## 2015-02-28 ENCOUNTER — Telehealth: Payer: Self-pay | Admitting: *Deleted

## 2015-02-28 NOTE — Telephone Encounter (Signed)
Spoke with Dr. Haynes Hoehn RN, Marzetta Board.  Per Dr. Alvy Bimler, indicated her order for 1 L NS over 2 hrs, labs to be drawn on Wednesdays.  I requested lab results to be faxed to my attention for Dr. Calton Dach review, noted that Dr. Alvy Bimler will adjust IVF based on lab results.  Stacy verbalized understanding.  Gayleen Orem, RN, BSN, [place] at [place] [phone]

## 2015-02-28 NOTE — Telephone Encounter (Signed)
Received call from [place], Dr. Haynes Hoehn RN, requesting clarification of IVF being requested by Dr. Alvy Bimler.  I indicated I would check with Dr. Alvy Bimler and get back with her.  Gayleen Orem, RN, BSN, [place] at [place] [phone]

## 2015-03-03 ENCOUNTER — Telehealth: Payer: Self-pay | Admitting: *Deleted

## 2015-03-03 NOTE — Telephone Encounter (Signed)
I returned patient wife's VM. 1. She reported Legrand Como went to ER last HS b/c of his concern of suspected aspiration of milkshake.  Chest X-ray indicated cloudiness in LLL, not interpreted as PNA.  Milkshake was suctioned from esophagus.  No fever evident, O2 sats good.  MD recommended Mucinex and PRN nebulizer. 2. She reported IVF were initiated on Wed of this week.  They are travelling to an infusion center in [place].  Labs were to be drawn today.  I asked her to reiterate lab draw hs been requested for Wednesdays. 3. She called later after receiving call from Dr. Haynes Hoehn office.  He has directed patient to start thickened liquids, pureed food. 4. She reported that they plan to move to [facility] the week after next treatment. I thanked her for the update, noted I would inform Dr. Alvy Bimler.  Gayleen Orem, RN, BSN, [place] at [place] [phone]

## 2015-03-06 ENCOUNTER — Telehealth: Payer: Self-pay | Admitting: *Deleted

## 2015-03-06 NOTE — Telephone Encounter (Signed)
Received call from patient's dtr, Chyrl Civatte.  She informed that her dad died this past Apr 05, 2023 evening.  She expressed her dad and his wife's appreciation for the care and attention provided by Dr. Alvy Bimler and  this navigator during his time at [facility].  I indicated that I would notify Dr. Alvy Bimler.  I will also notify [place] for cancellation of next shipment of [place].  Gayleen Orem, RN, BSN, [place] at [place] [phone]

## 2015-03-07 ENCOUNTER — Other Ambulatory Visit: Payer: Self-pay | Admitting: Hematology and Oncology

## 2015-03-07 NOTE — Telephone Encounter (Signed)
Thanks Higher education careers adviser for [employer]. Very sad indeed I have cancelled all his appt

## 2015-03-17 ENCOUNTER — Ambulatory Visit: Payer: Medicare Other | Admitting: Hematology and Oncology

## 2015-03-17 ENCOUNTER — Other Ambulatory Visit: Payer: Medicare Other

## 2015-03-17 ENCOUNTER — Ambulatory Visit: Payer: Medicare Other

## 2015-03-17 DEATH — deceased

## 2015-03-23 ENCOUNTER — Telehealth: Payer: Self-pay | Admitting: *Deleted

## 2015-03-23 NOTE — Telephone Encounter (Signed)
Spoke with patient's wife, Santiago Glad, to touch base after his death.  She recapped his final days, his rapid demise.  She expressed deep appreciation for the support provided by [facility], in particular Dr. Alvy Bimler and this navigator.  Gayleen Orem, RN, BSN, [place] [facility] [place] [phone]

## 2016-02-21 IMAGING — US IR FLUORO GUIDE CV LINE*R*
1 series · 1 of 1 positions shown · non-contrast
Comparison: none

CLINICAL DATA: Squamous carcinoma of the face with extensive facial
destruction and visual loss. The patient requires a porta cath for
additional chemotherapy treatment.

[Series 1: ir us guide vasc access*right* · 1 of 1 slices shown]
[im 1/1]
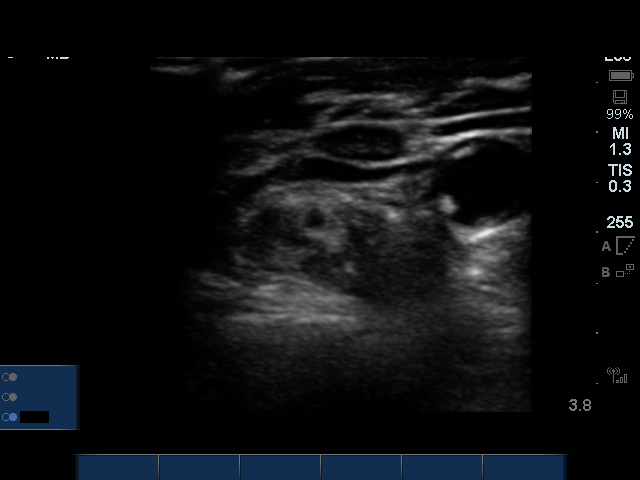

[1 of 1 positions shown; findings below may reference images not displayed]

EXAM:
IMPLANTED PORT A CATH PLACEMENT WITH ULTRASOUND AND FLUOROSCOPIC
GUIDANCE

ANESTHESIA/SEDATION:
0.5 Mg IV Versed; 25 mcg IV Fentanyl

Total Moderate Sedation Time:  41 minutes.

Additional Medications: 2 g IV Ancef. As antibiotic prophylaxis,
Ancef was ordered pre-procedure and administered intravenously
within one hour of incision.

FLUOROSCOPY TIME:  minutes

PROCEDURE:
The procedure, risks, benefits, and alternatives were explained to
the patient. Questions regarding the procedure were encouraged and
answered. The patient understands and consents to the procedure.

The right neck and chest were prepped with chlorhexidine in a
sterile fashion, and a sterile drape was applied covering the
operative field. Maximum barrier sterile technique with sterile
gowns and gloves were used for the procedure. Local anesthesia was
provided with 1% lidocaine. A time-out was performed prior to the
procedure.

Ultrasound was used to confirm patency of the right internal jugular
vein. After creating a small venotomy incision, a 21 gauge needle
was advanced into the right internal jugular vein under direct,
real-time ultrasound guidance. Ultrasound image documentation was
performed. After securing guidewire access, an 8 Fr dilator was
placed. A J-wire was kinked to measure appropriate catheter length.

A subcutaneous port pocket was then created along the upper chest
wall utilizing sharp and blunt dissection. Portable cautery was
utilized. The pocket was irrigated with sterile saline.

A single lumen power injectable port was chosen for placement. The 8
Fr catheter was tunneled from the port pocket site to the venotomy
incision. The port was placed in the pocket. External catheter was
trimmed to appropriate length based on guidewire measurement.

At the venotomy, an 8 Fr peel-away sheath was placed over a
guidewire. The catheter was then placed through the sheath and the
sheath removed. Final catheter positioning was confirmed and
documented with a fluoroscopic spot image. The port was accessed
with a needle and aspirated and flushed with heparinized saline. The
needle was removed.

The venotomy and port pocket incisions were closed with subcutaneous
3-0 Monocryl and subcuticular 4-0 Vicryl. Dermabond was applied to
both incisions.

COMPLICATIONS:
None
FINDINGS: After catheter placement, the tip lies at the cavoatrial junction.
The catheter aspirates normally and is ready for immediate use.
IMPRESSION: Placement of single lumen port a cath via right internal jugular
vein. The catheter tip lies at the cavoatrial junction. A power
injectable port a cath was placed and is ready for immediate use.
